# Patient Record
Sex: Female | Born: 1950
Health system: Southern US, Community
[De-identification: ages and names within clinical notes are randomized; demographics above are authoritative.]

## PROBLEM LIST (undated history)

## (undated) DIAGNOSIS — R350 Frequency of micturition: Secondary | ICD-10-CM

## (undated) DIAGNOSIS — I10 Essential (primary) hypertension: Secondary | ICD-10-CM

## (undated) DIAGNOSIS — H409 Unspecified glaucoma: Secondary | ICD-10-CM

## (undated) DIAGNOSIS — C679 Malignant neoplasm of bladder, unspecified: Secondary | ICD-10-CM

## (undated) DIAGNOSIS — Z87898 Personal history of other specified conditions: Secondary | ICD-10-CM

## (undated) DIAGNOSIS — R319 Hematuria, unspecified: Secondary | ICD-10-CM

## (undated) HISTORY — PX: KNEE ARTHROSCOPY: SUR90

## (undated) HISTORY — PX: BREAST CYST ASPIRATION: SHX578

## (undated) HISTORY — PX: TONSILLECTOMY: SUR1361

---

## 1993-02-08 HISTORY — PX: SUPRACERVICAL ABDOMINAL HYSTERECTOMY: SHX5393

## 1997-05-20 ENCOUNTER — Other Ambulatory Visit: Admission: RE | Admit: 1997-05-20 | Discharge: 1997-05-20 | Payer: Self-pay | Admitting: Gynecology

## 1997-07-17 ENCOUNTER — Inpatient Hospital Stay (HOSPITAL_COMMUNITY): Admission: RE | Admit: 1997-07-17 | Discharge: 1997-07-19 | Payer: Self-pay | Admitting: Gynecology

## 1999-01-08 ENCOUNTER — Other Ambulatory Visit: Admission: RE | Admit: 1999-01-08 | Discharge: 1999-01-08 | Payer: Self-pay | Admitting: Gynecology

## 1999-01-23 ENCOUNTER — Encounter: Payer: Self-pay | Admitting: Gynecology

## 1999-01-23 ENCOUNTER — Encounter: Admission: RE | Admit: 1999-01-23 | Discharge: 1999-01-23 | Payer: Self-pay | Admitting: Gynecology

## 2000-01-11 ENCOUNTER — Other Ambulatory Visit: Admission: RE | Admit: 2000-01-11 | Discharge: 2000-01-11 | Payer: Self-pay | Admitting: Gynecology

## 2000-02-23 ENCOUNTER — Encounter: Admission: RE | Admit: 2000-02-23 | Discharge: 2000-02-23 | Payer: Self-pay | Admitting: Gynecology

## 2000-02-23 ENCOUNTER — Encounter: Payer: Self-pay | Admitting: Gynecology

## 2001-08-09 ENCOUNTER — Other Ambulatory Visit: Admission: RE | Admit: 2001-08-09 | Discharge: 2001-08-09 | Payer: Self-pay | Admitting: Gynecology

## 2001-11-03 ENCOUNTER — Encounter: Admission: RE | Admit: 2001-11-03 | Discharge: 2001-11-03 | Payer: Self-pay | Admitting: Gynecology

## 2001-11-03 ENCOUNTER — Encounter: Payer: Self-pay | Admitting: Gynecology

## 2002-09-12 ENCOUNTER — Other Ambulatory Visit: Admission: RE | Admit: 2002-09-12 | Discharge: 2002-09-12 | Payer: Self-pay | Admitting: Gynecology

## 2003-01-11 ENCOUNTER — Ambulatory Visit (HOSPITAL_COMMUNITY): Admission: RE | Admit: 2003-01-11 | Discharge: 2003-01-11 | Payer: Self-pay | Admitting: Gynecology

## 2003-09-16 ENCOUNTER — Other Ambulatory Visit: Admission: RE | Admit: 2003-09-16 | Discharge: 2003-09-16 | Payer: Self-pay | Admitting: Gynecology

## 2004-04-13 ENCOUNTER — Ambulatory Visit (HOSPITAL_COMMUNITY): Admission: RE | Admit: 2004-04-13 | Discharge: 2004-04-13 | Payer: Self-pay | Admitting: Gynecology

## 2004-11-18 ENCOUNTER — Other Ambulatory Visit: Admission: RE | Admit: 2004-11-18 | Discharge: 2004-11-18 | Payer: Self-pay | Admitting: Gynecology

## 2005-04-15 ENCOUNTER — Ambulatory Visit (HOSPITAL_COMMUNITY): Admission: RE | Admit: 2005-04-15 | Discharge: 2005-04-15 | Payer: Self-pay | Admitting: Gynecology

## 2006-01-24 ENCOUNTER — Other Ambulatory Visit: Admission: RE | Admit: 2006-01-24 | Discharge: 2006-01-24 | Payer: Self-pay | Admitting: Gynecology

## 2006-04-18 ENCOUNTER — Ambulatory Visit (HOSPITAL_COMMUNITY): Admission: RE | Admit: 2006-04-18 | Discharge: 2006-04-18 | Payer: Self-pay | Admitting: Gynecology

## 2007-01-27 ENCOUNTER — Other Ambulatory Visit: Admission: RE | Admit: 2007-01-27 | Discharge: 2007-01-27 | Payer: Self-pay | Admitting: Gynecology

## 2007-04-21 ENCOUNTER — Ambulatory Visit (HOSPITAL_COMMUNITY): Admission: RE | Admit: 2007-04-21 | Discharge: 2007-04-21 | Payer: Self-pay | Admitting: Gynecology

## 2008-05-08 ENCOUNTER — Ambulatory Visit (HOSPITAL_COMMUNITY): Admission: RE | Admit: 2008-05-08 | Discharge: 2008-05-08 | Payer: Self-pay | Admitting: Gynecology

## 2009-05-09 ENCOUNTER — Ambulatory Visit (HOSPITAL_COMMUNITY): Admission: RE | Admit: 2009-05-09 | Discharge: 2009-05-09 | Payer: Self-pay | Admitting: Gynecology

## 2010-05-25 ENCOUNTER — Other Ambulatory Visit (HOSPITAL_COMMUNITY): Payer: Self-pay | Admitting: Gynecology

## 2010-05-25 DIAGNOSIS — Z1231 Encounter for screening mammogram for malignant neoplasm of breast: Secondary | ICD-10-CM

## 2010-05-28 ENCOUNTER — Ambulatory Visit (HOSPITAL_COMMUNITY)
Admission: RE | Admit: 2010-05-28 | Discharge: 2010-05-28 | Disposition: A | Discharge status: Home / Self Care | Payer: BC Managed Care – PPO | Source: Ambulatory Visit | Attending: Gynecology | Admitting: Gynecology

## 2010-05-28 DIAGNOSIS — Z1231 Encounter for screening mammogram for malignant neoplasm of breast: Secondary | ICD-10-CM | POA: Insufficient documentation

## 2011-05-24 ENCOUNTER — Other Ambulatory Visit (HOSPITAL_COMMUNITY): Payer: Self-pay | Admitting: Gynecology

## 2011-05-24 DIAGNOSIS — Z1231 Encounter for screening mammogram for malignant neoplasm of breast: Secondary | ICD-10-CM

## 2011-07-07 ENCOUNTER — Ambulatory Visit (HOSPITAL_COMMUNITY): Payer: BC Managed Care – PPO

## 2011-07-08 ENCOUNTER — Ambulatory Visit (HOSPITAL_COMMUNITY)
Admission: RE | Admit: 2011-07-08 | Discharge: 2011-07-08 | Disposition: A | Discharge status: Home / Self Care | Payer: BC Managed Care – PPO | Source: Ambulatory Visit | Attending: Gynecology | Admitting: Gynecology

## 2011-07-08 DIAGNOSIS — Z1231 Encounter for screening mammogram for malignant neoplasm of breast: Secondary | ICD-10-CM

## 2012-08-21 ENCOUNTER — Other Ambulatory Visit (HOSPITAL_COMMUNITY): Payer: Self-pay | Admitting: Obstetrics and Gynecology

## 2012-08-21 DIAGNOSIS — Z1231 Encounter for screening mammogram for malignant neoplasm of breast: Secondary | ICD-10-CM

## 2012-09-01 ENCOUNTER — Ambulatory Visit (HOSPITAL_COMMUNITY)
Admission: RE | Admit: 2012-09-01 | Discharge: 2012-09-01 | Disposition: A | Discharge status: Home / Self Care | Payer: BC Managed Care – PPO | Source: Ambulatory Visit | Attending: Obstetrics and Gynecology | Admitting: Obstetrics and Gynecology

## 2012-09-01 DIAGNOSIS — Z1231 Encounter for screening mammogram for malignant neoplasm of breast: Secondary | ICD-10-CM | POA: Insufficient documentation

## 2013-07-31 ENCOUNTER — Other Ambulatory Visit (HOSPITAL_COMMUNITY): Payer: Self-pay | Admitting: Internal Medicine

## 2013-07-31 DIAGNOSIS — Z1231 Encounter for screening mammogram for malignant neoplasm of breast: Secondary | ICD-10-CM

## 2013-09-04 ENCOUNTER — Ambulatory Visit (HOSPITAL_COMMUNITY): Payer: BC Managed Care – PPO

## 2013-09-12 ENCOUNTER — Ambulatory Visit (HOSPITAL_COMMUNITY)
Admission: RE | Admit: 2013-09-12 | Discharge: 2013-09-12 | Disposition: A | Discharge status: Home / Self Care | Payer: BC Managed Care – PPO | Source: Ambulatory Visit | Attending: Internal Medicine | Admitting: Internal Medicine

## 2013-09-12 DIAGNOSIS — Z1231 Encounter for screening mammogram for malignant neoplasm of breast: Secondary | ICD-10-CM | POA: Insufficient documentation

## 2014-02-08 HISTORY — PX: BREAST BIOPSY: SHX20

## 2014-11-04 ENCOUNTER — Other Ambulatory Visit: Payer: Self-pay

## 2014-11-04 DIAGNOSIS — Z1231 Encounter for screening mammogram for malignant neoplasm of breast: Secondary | ICD-10-CM

## 2014-11-08 ENCOUNTER — Ambulatory Visit
Admission: RE | Admit: 2014-11-08 | Discharge: 2014-11-08 | Disposition: A | Discharge status: Home / Self Care | Payer: BC Managed Care – PPO | Source: Ambulatory Visit

## 2014-11-08 DIAGNOSIS — Z1231 Encounter for screening mammogram for malignant neoplasm of breast: Secondary | ICD-10-CM

## 2014-11-12 ENCOUNTER — Other Ambulatory Visit: Payer: Self-pay | Admitting: Internal Medicine

## 2014-11-12 DIAGNOSIS — R928 Other abnormal and inconclusive findings on diagnostic imaging of breast: Secondary | ICD-10-CM

## 2014-11-20 ENCOUNTER — Ambulatory Visit
Admission: RE | Admit: 2014-11-20 | Discharge: 2014-11-20 | Disposition: A | Discharge status: Home / Self Care | Payer: BC Managed Care – PPO | Source: Ambulatory Visit | Attending: Internal Medicine | Admitting: Internal Medicine

## 2014-11-20 ENCOUNTER — Other Ambulatory Visit: Payer: Self-pay | Admitting: Internal Medicine

## 2014-11-20 DIAGNOSIS — R928 Other abnormal and inconclusive findings on diagnostic imaging of breast: Secondary | ICD-10-CM

## 2014-11-20 DIAGNOSIS — N6310 Unspecified lump in the right breast, unspecified quadrant: Secondary | ICD-10-CM

## 2014-11-21 ENCOUNTER — Other Ambulatory Visit: Payer: Self-pay | Admitting: Internal Medicine

## 2014-11-21 ENCOUNTER — Ambulatory Visit
Admission: RE | Admit: 2014-11-21 | Discharge: 2014-11-21 | Disposition: A | Discharge status: Home / Self Care | Payer: BC Managed Care – PPO | Source: Ambulatory Visit | Attending: Internal Medicine | Admitting: Internal Medicine

## 2014-11-21 DIAGNOSIS — N6310 Unspecified lump in the right breast, unspecified quadrant: Secondary | ICD-10-CM

## 2015-10-02 ENCOUNTER — Encounter (INDEPENDENT_AMBULATORY_CARE_PROVIDER_SITE_OTHER): Payer: Self-pay

## 2015-10-02 ENCOUNTER — Other Ambulatory Visit: Payer: Self-pay | Admitting: Internal Medicine

## 2015-10-02 ENCOUNTER — Ambulatory Visit (HOSPITAL_COMMUNITY): Payer: Medicare Other | Attending: Cardiology

## 2015-10-02 ENCOUNTER — Other Ambulatory Visit: Payer: Self-pay

## 2015-10-02 DIAGNOSIS — I340 Nonrheumatic mitral (valve) insufficiency: Secondary | ICD-10-CM | POA: Insufficient documentation

## 2015-10-02 DIAGNOSIS — R011 Cardiac murmur, unspecified: Secondary | ICD-10-CM | POA: Diagnosis not present

## 2015-10-02 DIAGNOSIS — I313 Pericardial effusion (noninflammatory): Secondary | ICD-10-CM | POA: Insufficient documentation

## 2015-10-02 DIAGNOSIS — R9431 Abnormal electrocardiogram [ECG] [EKG]: Secondary | ICD-10-CM

## 2015-12-10 ENCOUNTER — Other Ambulatory Visit: Payer: Self-pay | Admitting: Internal Medicine

## 2015-12-10 DIAGNOSIS — Z1231 Encounter for screening mammogram for malignant neoplasm of breast: Secondary | ICD-10-CM

## 2015-12-11 ENCOUNTER — Ambulatory Visit
Admission: RE | Admit: 2015-12-11 | Discharge: 2015-12-11 | Disposition: A | Discharge status: Home / Self Care | Payer: Medicare Other | Source: Ambulatory Visit | Attending: Internal Medicine | Admitting: Internal Medicine

## 2015-12-11 DIAGNOSIS — Z1231 Encounter for screening mammogram for malignant neoplasm of breast: Secondary | ICD-10-CM

## 2016-02-09 HISTORY — PX: CATARACT EXTRACTION W/ INTRAOCULAR LENS  IMPLANT, BILATERAL: SHX1307

## 2016-12-22 ENCOUNTER — Other Ambulatory Visit: Payer: Self-pay | Admitting: Obstetrics and Gynecology

## 2016-12-22 DIAGNOSIS — Z1231 Encounter for screening mammogram for malignant neoplasm of breast: Secondary | ICD-10-CM

## 2017-01-19 ENCOUNTER — Ambulatory Visit
Admission: RE | Admit: 2017-01-19 | Discharge: 2017-01-19 | Disposition: A | Discharge status: Home / Self Care | Payer: Medicare Other | Source: Ambulatory Visit | Attending: Obstetrics and Gynecology | Admitting: Obstetrics and Gynecology

## 2017-01-19 DIAGNOSIS — Z1231 Encounter for screening mammogram for malignant neoplasm of breast: Secondary | ICD-10-CM

## 2017-12-09 ENCOUNTER — Other Ambulatory Visit: Payer: Self-pay | Admitting: Obstetrics and Gynecology

## 2017-12-09 DIAGNOSIS — Z1231 Encounter for screening mammogram for malignant neoplasm of breast: Secondary | ICD-10-CM

## 2018-01-23 ENCOUNTER — Ambulatory Visit
Admission: RE | Admit: 2018-01-23 | Discharge: 2018-01-23 | Disposition: A | Discharge status: Home / Self Care | Payer: Medicare Other | Source: Ambulatory Visit | Attending: Obstetrics and Gynecology | Admitting: Obstetrics and Gynecology

## 2018-01-23 DIAGNOSIS — Z1231 Encounter for screening mammogram for malignant neoplasm of breast: Secondary | ICD-10-CM

## 2019-01-03 ENCOUNTER — Other Ambulatory Visit: Payer: Self-pay | Admitting: Obstetrics and Gynecology

## 2019-01-03 DIAGNOSIS — Z1231 Encounter for screening mammogram for malignant neoplasm of breast: Secondary | ICD-10-CM

## 2019-03-05 ENCOUNTER — Ambulatory Visit: Payer: Medicare Other

## 2019-03-08 ENCOUNTER — Ambulatory Visit
Admission: RE | Admit: 2019-03-08 | Discharge: 2019-03-08 | Disposition: A | Discharge status: Home / Self Care | Payer: Medicare PPO | Source: Ambulatory Visit | Attending: Obstetrics and Gynecology | Admitting: Obstetrics and Gynecology

## 2019-03-08 ENCOUNTER — Other Ambulatory Visit: Payer: Self-pay

## 2019-03-08 DIAGNOSIS — Z1231 Encounter for screening mammogram for malignant neoplasm of breast: Secondary | ICD-10-CM

## 2019-08-28 DIAGNOSIS — Z6825 Body mass index (BMI) 25.0-25.9, adult: Secondary | ICD-10-CM | POA: Diagnosis not present

## 2019-08-28 DIAGNOSIS — R319 Hematuria, unspecified: Secondary | ICD-10-CM | POA: Diagnosis not present

## 2019-08-28 DIAGNOSIS — R300 Dysuria: Secondary | ICD-10-CM | POA: Diagnosis not present

## 2019-09-10 DIAGNOSIS — Z961 Presence of intraocular lens: Secondary | ICD-10-CM | POA: Diagnosis not present

## 2019-09-10 DIAGNOSIS — H401131 Primary open-angle glaucoma, bilateral, mild stage: Secondary | ICD-10-CM | POA: Diagnosis not present

## 2019-10-29 DIAGNOSIS — E7800 Pure hypercholesterolemia, unspecified: Secondary | ICD-10-CM | POA: Diagnosis not present

## 2019-10-29 DIAGNOSIS — I10 Essential (primary) hypertension: Secondary | ICD-10-CM | POA: Diagnosis not present

## 2019-10-30 DIAGNOSIS — Z23 Encounter for immunization: Secondary | ICD-10-CM | POA: Diagnosis not present

## 2019-10-30 DIAGNOSIS — Z1389 Encounter for screening for other disorder: Secondary | ICD-10-CM | POA: Diagnosis not present

## 2019-10-30 DIAGNOSIS — R350 Frequency of micturition: Secondary | ICD-10-CM | POA: Diagnosis not present

## 2019-10-30 DIAGNOSIS — N3941 Urge incontinence: Secondary | ICD-10-CM | POA: Diagnosis not present

## 2019-10-30 DIAGNOSIS — Z0000 Encounter for general adult medical examination without abnormal findings: Secondary | ICD-10-CM | POA: Diagnosis not present

## 2019-10-30 DIAGNOSIS — I10 Essential (primary) hypertension: Secondary | ICD-10-CM | POA: Diagnosis not present

## 2019-10-30 DIAGNOSIS — E7800 Pure hypercholesterolemia, unspecified: Secondary | ICD-10-CM | POA: Diagnosis not present

## 2019-10-30 DIAGNOSIS — R310 Gross hematuria: Secondary | ICD-10-CM | POA: Diagnosis not present

## 2019-11-01 ENCOUNTER — Other Ambulatory Visit: Payer: Self-pay | Admitting: Internal Medicine

## 2019-11-01 DIAGNOSIS — E7800 Pure hypercholesterolemia, unspecified: Secondary | ICD-10-CM

## 2019-11-09 DIAGNOSIS — R310 Gross hematuria: Secondary | ICD-10-CM | POA: Diagnosis not present

## 2019-11-09 DIAGNOSIS — R350 Frequency of micturition: Secondary | ICD-10-CM | POA: Diagnosis not present

## 2019-11-12 DIAGNOSIS — N281 Cyst of kidney, acquired: Secondary | ICD-10-CM | POA: Diagnosis not present

## 2019-11-12 DIAGNOSIS — N329 Bladder disorder, unspecified: Secondary | ICD-10-CM | POA: Diagnosis not present

## 2019-11-12 DIAGNOSIS — R310 Gross hematuria: Secondary | ICD-10-CM | POA: Diagnosis not present

## 2019-11-12 DIAGNOSIS — R319 Hematuria, unspecified: Secondary | ICD-10-CM | POA: Diagnosis not present

## 2019-11-30 DIAGNOSIS — R310 Gross hematuria: Secondary | ICD-10-CM | POA: Diagnosis not present

## 2019-11-30 DIAGNOSIS — C678 Malignant neoplasm of overlapping sites of bladder: Secondary | ICD-10-CM | POA: Diagnosis not present

## 2019-12-03 ENCOUNTER — Other Ambulatory Visit: Payer: Self-pay | Admitting: Urology

## 2019-12-10 ENCOUNTER — Other Ambulatory Visit: Payer: Self-pay

## 2019-12-10 ENCOUNTER — Encounter (HOSPITAL_BASED_OUTPATIENT_CLINIC_OR_DEPARTMENT_OTHER): Payer: Self-pay | Admitting: Urology

## 2019-12-10 NOTE — Progress Notes (Signed)
Spoke w/ via phone for pre-op interview--- PT Lab needs dos----   Istat and EKG            Lab results------ no COVID test ------ 12-14-2019 @1435  Arrive at ------- 0800 NPO after MN NO Solid Food.  Clear liquids from MN until--- 0700 Medications to take morning of surgery ----- Norvasc and eye drops as usual Diabetic medication -----  n/a Patient Special Instructions ----- n/a Pre-Op special Istructions ----- n/a Patient verbalized understanding of instructions that were given at this phone interview. Patient denies shortness of breath, chest pain, fever, cough at this phone interview.

## 2019-12-14 ENCOUNTER — Other Ambulatory Visit (HOSPITAL_COMMUNITY)
Admission: RE | Admit: 2019-12-14 | Discharge: 2019-12-14 | Disposition: A | Discharge status: Home / Self Care | Payer: Medicare PPO | Source: Ambulatory Visit | Attending: Urology | Admitting: Urology

## 2019-12-14 DIAGNOSIS — Z01812 Encounter for preprocedural laboratory examination: Secondary | ICD-10-CM | POA: Insufficient documentation

## 2019-12-14 DIAGNOSIS — Z20822 Contact with and (suspected) exposure to covid-19: Secondary | ICD-10-CM | POA: Diagnosis not present

## 2019-12-14 LAB — SARS CORONAVIRUS 2 (TAT 6-24 HRS): SARS Coronavirus 2: NEGATIVE

## 2019-12-17 NOTE — H&P (Signed)
: was consulted to assess the patient's recurrent blood in the urine. She primarily sees it when she wipes. Two months ago she describes a visit to urgent care. No increased frequency or any pain. No smoking history. Does not take daily aspirin or blood thinner   At baseline I think she has mild urge incontinence and will wear a pad when she leaves the home. No stress incontinence.   She voids every 1 hour cannot hold it for 2 hours. She gets up at least 3 times a night. No ankle edema. No diuretic   patient has a history hematuria. She has a frequent bladder both day and night. She has little bit urge incontinence. After I work her up for hematuria I will ask her wetter treatment goals are for her overactive bladder. She will have a CT scan and return for pelvic and cystoscopy that was deferred for insurance reasons. Call if urine culture positive   Today  I did not treat the low count of Streptococcus. No CT scan done.  on cystoscopy the patient has multiple bladder tumors. There were more on the right posterior wall and floor the largest probably was approximately 2 cm or less. There was multiple small tumors all looks superficial. She did have some prominent blood vessels but the could be areas of carcinoma in situ or patches of redness  picture was drawn. Likely patient has superficial bladder cancer and possibly could have carcinoma in situ. She will get a CT scan on Monday and see my partner who will review trans urethral resections and biopsies  CLINICAL DATA: Hematuria   EXAM:  CT ABDOMEN AND PELVIS WITHOUT AND WITH CONTRAST   TECHNIQUE:  Multidetector CT imaging of the abdomen and pelvis was performed  following the standard protocol before and following the bolus  administration of intravenous contrast.   CONTRAST: 125 mL Omnipaque 300 IV   COMPARISON: None.   FINDINGS:  Lower chest: Lung bases are clear.   Hepatobiliary: Two subcentimeter cysts in the right hepatic dome   (series 5/image 10).   Gallbladder is underdistended but grossly unremarkable. No  intrahepatic or extrahepatic ductal dilatation.   Pancreas: Within normal limits.   Spleen: Within normal limits.   Adrenals/Urinary Tract: Adrenal glands are within normal limits.   8 mm cyst in the posterior interpolar right kidney (series 5/image  33). No enhancing renal lesions.   No renal, ureteral, or bladder calculi. No hydronephrosis.   On delayed imaging, there are no filling defects in the bilateral  renal collecting systems or ureters.   Polypoid filling defect in the right posterior bladder (series  9/image 79), with a corresponding 2.1 x 2.6 cm enhancing lesion  (series 5/image 79). Additional small polypoid enhancing lesion  along the left anterior bladder (series 5/image 75) raising concern  for multifocal disease.   Stomach/Bowel: Stomach is within normal limits.   No evidence of bowel obstruction.   Normal appendix (series 5/image 53).   Vascular/Lymphatic: No evidence of abdominal aortic aneurysm.   Atherosclerotic calcifications of the abdominal aorta and branch  vessels.   No suspicious abdominopelvic lymphadenopathy.   Reproductive: Status post supracervical hysterectomy.   No adnexal masses.   Other: No abdominopelvic ascites.   Musculoskeletal: Visualized osseous structures are within normal  limits.   IMPRESSION:  2.6 cm enhancing lesion along the right posterior bladder,  suspicious for primary bladder neoplasm. Possible additional lesion  in the left anterior bladder. Cystoscopy is suggested.   No findings suspicious  for upper tract or metastatic disease.   These results will be called to the ordering clinician or  representative by the Radiologist Assistant, and communication  documented in the PACS or [facility] Dashboard.  -11/30/19-patient is a 69 year old female previously followed by Dr.MacDiarmid all with history of hematuria. Patient underwent  cystoscopy which showed multiple papillary tumors within the urinary bladder. Also has had a CT scan subsequently showing centrally normal upper urinary tract but filling defects within the urinary bladder as seen on cysto. Now here for follow-up discussion regarding management of her bladder tumors.      ALLERGIES: None   MEDICATIONS: None   GU PSH: Cystoscopy - 11/09/2019 Locm 300-399Mg /Ml Iodine,1Ml - 11/12/2019     NON-GU PSH: None   GU PMH: Gross hematuria - 11/12/2019, - 11/09/2019, - 10/30/2019 Urinary Frequency - 11/09/2019, - 10/30/2019 Urge incontinence - 10/30/2019    NON-GU PMH: None   FAMILY HISTORY: None   SOCIAL HISTORY: Marital Status: Married Preferred Language: English; Ethnicity: Not Hispanic Or Latino; Race: Black or African American Current Smoking Status: Patient has never smoked.   Tobacco Use Assessment Completed: Used Tobacco in last 30 days? Does not use smokeless tobacco. Does drink.  Does not use drugs. Drinks 2 caffeinated drinks per day. Has not had a blood transfusion.    REVIEW OF SYSTEMS:    GU Review Female:   Patient denies frequent urination, hard to postpone urination, burning /pain with urination, get up at night to urinate, leakage of urine, stream starts and stops, trouble starting your stream, have to strain to urinate, and being pregnant.  Gastrointestinal (Upper):   Patient denies nausea, vomiting, and indigestion/ heartburn.  Gastrointestinal (Lower):   Patient denies diarrhea and constipation.  Constitutional:   Patient denies fever, night sweats, weight loss, and fatigue.  Skin:   Patient denies skin rash/ lesion and itching.  Eyes:   Patient denies blurred vision and double vision.  Ears/ Nose/ Throat:   Patient denies sore throat and sinus problems.  Hematologic/Lymphatic:   Patient denies swollen glands and easy bruising.  Cardiovascular:   Patient denies leg swelling and chest pains.  Respiratory:   Patient denies cough and  shortness of breath.  Endocrine:   Patient denies excessive thirst.  Musculoskeletal:   Patient denies joint pain and back pain.  Neurological:   Patient denies headaches and dizziness.  Psychologic:   Patient denies depression and anxiety.   VITAL SIGNS:      11/30/2019 02:44 PM  Weight 155 lb / 70.31 kg  Height 66 in / 167.64 cm  BP 162/83 mmHg  Heart Rate 64 /min  Temperature 98.2 F / 36.7 C  BMI 25.0 kg/m   GU PHYSICAL EXAMINATION:    Breast: Symmetrical. No tenderness, no nipple discharge, no skin changes. No mass.  Digital Rectal Exam: Normal sphincter tone. No rectal mass.  External Genitalia: No hirsutism, no rash, no scarring, no cyst, no erythematous lesion, no papular lesion, no blanched lesion, no warty lesion. No edema.  Urethral Meatus: Normal size. Normal position. No discharge.  Urethra: No tenderness, no mass, no scarring. No hypermobility. No leakage.  Bladder: Normal to palpation, no tenderness, no mass, normal size.  Vagina: No atrophy, no stenosis. No rectocele. No cystocele. No enterocele.  Cervix: No inflammation, no discharge, no lesion, no tenderness, no wart.  Uterus: Normal size. Normal consistency. Normal position. No mobility. No descent.  Adnexa / Parametria: No tenderness. No adnexal mass. Normal left ovary. Normal right ovary.  Anus and Perineum: No hemorrhoids. No anal stenosis. No rectal fissure, no anal fissure. No edema, no dimple, no perineal tenderness, no anal tenderness.   MULTI-SYSTEM PHYSICAL EXAMINATION:    Constitutional: Well-nourished. No physical deformities. Normally developed. Good grooming.  Neck: Neck symmetrical, not swollen. Normal tracheal position.  Respiratory: No labored breathing, no use of accessory muscles.   Cardiovascular: Normal temperature, normal extremity pulses, no swelling, no varicosities.  Lymphatic: No enlargement of neck, axillae, groin.  Skin: No paleness, no jaundice, no cyanosis. No lesion, no ulcer, no  rash.  Neurologic / Psychiatric: Oriented to time, oriented to place, oriented to person. No depression, no anxiety, no agitation.  Gastrointestinal: No mass, no tenderness, no rigidity, non obese abdomen.  Eyes: Normal conjunctivae. Normal eyelids.  Ears, Nose, Mouth, and Throat: Left ear no scars, no lesions, no masses. Right ear no scars, no lesions, no masses. Nose no scars, no lesions, no masses. Normal hearing. Normal lips.  Musculoskeletal: Normal gait and station of head and neck.     PAST DATA REVIEW: None   PROCEDURES:          Urinalysis w/Scope - 81001 Dipstick Dipstick Cont'd Micro  Color: Straw Bilirubin: Neg mg/dL WBC/hpf: 0 - 5/hpf  Appearance: Slightly Cloudy Ketones: Neg mg/dL RBC/hpf: 0 - 2/hpf  Specific Gravity: 1.015 Blood: 1+ ery/uL Bacteria: NS (Not Seen)  pH: 6.5 Protein: Neg mg/dL Cystals: NS (Not Seen)  Glucose: Neg mg/dL Urobilinogen: 0.2 mg/dL Casts: NS (Not Seen)    Nitrites: Neg Trichomonas: Not Present    Leukocyte Esterase: Trace leu/uL Mucous: Present      Epithelial Cells: NS (Not Seen)      Yeast: NS (Not Seen)      Sperm: Not Present    ASSESSMENT:      ICD-10 Details  1 GU:   Gross hematuria - J19.4 Acute, Complicated Injury  2   Bladder Cancer overlapping sites - R74.0 Acute, Complicated Injury   PLAN:           Document Letter(s):  Created for Patient: Clinical Summary         Notes:   I discussed the CT findings along with Dr.MacDiarmid's cystoscopic findings highly suggestive of primary bladder cancer. Recommended cysto and retrogrades and TURBT. Risks and benefits the procedure were discussed in detail today. Will schedule accordingly in the near future.  TURBT consent: I have discussed with the patient the risks, benefits of TURBT which include but are not limited to: Bleeding, infection, damage to the bladder with potential perforation of the bladder, damage to surrounding organs, possible need for further procedures including open  repair and catheterization, possibility of nonhealing area within the bladder, urgency, frequency which may be refractory to medications. I pointed out that in some occasions after resection of the bladder tumor, mitomycin-C chemotherapy may be instilled into the bladder. The risks associated with this therapy include but are not limited to: Refractory or new onset urgency, frequency, dysuria, infrequently severe systemic side effects secondary to mitomycin-C. After full discussion of the risks, benefits and alternatives, the patient has consented to the above procedure and desires to proceed.

## 2019-12-18 ENCOUNTER — Ambulatory Visit (HOSPITAL_BASED_OUTPATIENT_CLINIC_OR_DEPARTMENT_OTHER): Payer: Medicare PPO | Admitting: Anesthesiology

## 2019-12-18 ENCOUNTER — Other Ambulatory Visit: Payer: Self-pay

## 2019-12-18 ENCOUNTER — Ambulatory Visit (HOSPITAL_BASED_OUTPATIENT_CLINIC_OR_DEPARTMENT_OTHER)
Admission: RE | Admit: 2019-12-18 | Discharge: 2019-12-19 | Disposition: A | Discharge status: Home / Self Care | Payer: Medicare PPO | Attending: Urology | Admitting: Urology

## 2019-12-18 ENCOUNTER — Encounter (HOSPITAL_BASED_OUTPATIENT_CLINIC_OR_DEPARTMENT_OTHER): Payer: Self-pay | Admitting: Urology

## 2019-12-18 ENCOUNTER — Encounter (HOSPITAL_BASED_OUTPATIENT_CLINIC_OR_DEPARTMENT_OTHER)
Admission: RE | Disposition: A | Discharge status: Home / Self Care | Payer: Self-pay | Source: Home / Self Care | Attending: Urology

## 2019-12-18 DIAGNOSIS — I10 Essential (primary) hypertension: Secondary | ICD-10-CM | POA: Diagnosis not present

## 2019-12-18 DIAGNOSIS — D494 Neoplasm of unspecified behavior of bladder: Secondary | ICD-10-CM | POA: Diagnosis not present

## 2019-12-18 DIAGNOSIS — C679 Malignant neoplasm of bladder, unspecified: Secondary | ICD-10-CM | POA: Diagnosis not present

## 2019-12-18 DIAGNOSIS — H409 Unspecified glaucoma: Secondary | ICD-10-CM | POA: Diagnosis not present

## 2019-12-18 HISTORY — DX: Unspecified glaucoma: H40.9

## 2019-12-18 HISTORY — DX: Essential (primary) hypertension: I10

## 2019-12-18 HISTORY — DX: Frequency of micturition: R35.0

## 2019-12-18 HISTORY — PX: TRANSURETHRAL RESECTION OF BLADDER TUMOR: SHX2575

## 2019-12-18 HISTORY — DX: Personal history of other specified conditions: Z87.898

## 2019-12-18 HISTORY — DX: Hematuria, unspecified: R31.9

## 2019-12-18 HISTORY — PX: CYSTOSCOPY W/ RETROGRADES: SHX1426

## 2019-12-18 HISTORY — DX: Malignant neoplasm of bladder, unspecified: C67.9

## 2019-12-18 LAB — POCT I-STAT, CHEM 8
BUN: 24 mg/dL — ABNORMAL HIGH (ref 8–23)
Calcium, Ion: 1.36 mmol/L (ref 1.15–1.40)
Chloride: 104 mmol/L (ref 98–111)
Creatinine, Ser: 0.6 mg/dL (ref 0.44–1.00)
Glucose, Bld: 98 mg/dL (ref 70–99)
HCT: 41 % (ref 36.0–46.0)
Hemoglobin: 13.9 g/dL (ref 12.0–15.0)
Potassium: 3.9 mmol/L (ref 3.5–5.1)
Sodium: 143 mmol/L (ref 135–145)
TCO2: 26 mmol/L (ref 22–32)

## 2019-12-18 SURGERY — TURBT (TRANSURETHRAL RESECTION OF BLADDER TUMOR)
Anesthesia: General | Site: Renal

## 2019-12-18 MED ORDER — SUGAMMADEX SODIUM 200 MG/2ML IV SOLN
INTRAVENOUS | Status: DC | PRN
  Administered 2019-12-18: 200 mg via INTRAVENOUS

## 2019-12-18 MED ORDER — ROCURONIUM BROMIDE 10 MG/ML (PF) SYRINGE
PREFILLED_SYRINGE | INTRAVENOUS | Status: AC
  Filled 2019-12-18: qty 10

## 2019-12-18 MED ORDER — OXYCODONE HCL 5 MG PO TABS: 5.0000 mg | ORAL_TABLET | Freq: Once | ORAL | Status: DC | PRN

## 2019-12-18 MED ORDER — FENTANYL CITRATE (PF) 100 MCG/2ML IJ SOLN
INTRAMUSCULAR | Status: DC | PRN
  Administered 2019-12-18: 25 ug via INTRAVENOUS
  Administered 2019-12-18: 50 ug via INTRAVENOUS
  Administered 2019-12-18: 25 ug via INTRAVENOUS

## 2019-12-18 MED ORDER — BELLADONNA ALKALOIDS-OPIUM 16.2-60 MG RE SUPP
RECTAL | Status: DC | PRN
  Administered 2019-12-18: 1 via RECTAL

## 2019-12-18 MED ORDER — ONDANSETRON HCL 4 MG/2ML IJ SOLN: 4.0000 mg | Freq: Once | INTRAMUSCULAR | Status: DC | PRN

## 2019-12-18 MED ORDER — HYDROCODONE-ACETAMINOPHEN 5-325 MG PO TABS
1.0000 | ORAL_TABLET | ORAL | Status: DC | PRN
  Administered 2019-12-19: 2 via ORAL
  Administered 2019-12-19: 1 via ORAL

## 2019-12-18 MED ORDER — PHENYLEPHRINE 40 MCG/ML (10ML) SYRINGE FOR IV PUSH (FOR BLOOD PRESSURE SUPPORT)
PREFILLED_SYRINGE | INTRAVENOUS | Status: DC | PRN
  Administered 2019-12-18: 120 ug via INTRAVENOUS
  Administered 2019-12-18 (×3): 80 ug via INTRAVENOUS

## 2019-12-18 MED ORDER — OXYBUTYNIN CHLORIDE 5 MG PO TABS
5.0000 mg | ORAL_TABLET | Freq: Three times a day (TID) | ORAL | Status: DC | PRN
  Administered 2019-12-18 – 2019-12-19 (×2): 5 mg via ORAL

## 2019-12-18 MED ORDER — PHENYLEPHRINE 40 MCG/ML (10ML) SYRINGE FOR IV PUSH (FOR BLOOD PRESSURE SUPPORT)
PREFILLED_SYRINGE | INTRAVENOUS | Status: AC
  Filled 2019-12-18: qty 10

## 2019-12-18 MED ORDER — ONDANSETRON HCL 4 MG/2ML IJ SOLN
INTRAMUSCULAR | Status: DC | PRN
  Administered 2019-12-18: 4 mg via INTRAVENOUS

## 2019-12-18 MED ORDER — ONDANSETRON HCL 4 MG/2ML IJ SOLN: 4.0000 mg | INTRAMUSCULAR | Status: DC | PRN

## 2019-12-18 MED ORDER — PROPOFOL 10 MG/ML IV BOLUS
INTRAVENOUS | Status: DC | PRN
  Administered 2019-12-18: 150 mg via INTRAVENOUS

## 2019-12-18 MED ORDER — FENTANYL CITRATE (PF) 100 MCG/2ML IJ SOLN
INTRAMUSCULAR | Status: AC
  Filled 2019-12-18: qty 2

## 2019-12-18 MED ORDER — CEFAZOLIN SODIUM-DEXTROSE 2-4 GM/100ML-% IV SOLN
INTRAVENOUS | Status: AC
  Filled 2019-12-18: qty 100

## 2019-12-18 MED ORDER — DEXAMETHASONE SODIUM PHOSPHATE 4 MG/ML IJ SOLN
INTRAMUSCULAR | Status: DC | PRN
  Administered 2019-12-18: 10 mg via INTRAVENOUS

## 2019-12-18 MED ORDER — EPHEDRINE SULFATE-NACL 50-0.9 MG/10ML-% IV SOSY
PREFILLED_SYRINGE | INTRAVENOUS | Status: DC | PRN
  Administered 2019-12-18: 10 mg via INTRAVENOUS

## 2019-12-18 MED ORDER — SODIUM CHLORIDE 0.9% FLUSH: 3.0000 mL | INTRAVENOUS | Status: DC | PRN

## 2019-12-18 MED ORDER — SODIUM CHLORIDE 0.9 % IV SOLN: 250.0000 mL | INTRAVENOUS | Status: DC | PRN

## 2019-12-18 MED ORDER — HYDROCODONE-ACETAMINOPHEN 5-325 MG PO TABS
ORAL_TABLET | ORAL | Status: AC
  Filled 2019-12-18: qty 1

## 2019-12-18 MED ORDER — PROPOFOL 500 MG/50ML IV EMUL
INTRAVENOUS | Status: AC
  Filled 2019-12-18: qty 100

## 2019-12-18 MED ORDER — AMLODIPINE BESYLATE 5 MG PO TABS
5.0000 mg | ORAL_TABLET | Freq: Every day | ORAL | Status: DC
  Filled 2019-12-18: qty 1

## 2019-12-18 MED ORDER — LATANOPROST 0.005 % OP SOLN
1.0000 [drp] | Freq: Every day | OPHTHALMIC | Status: DC
  Filled 2019-12-18: qty 2.5

## 2019-12-18 MED ORDER — BELLADONNA ALKALOIDS-OPIUM 16.2-60 MG RE SUPP: 1.0000 | Freq: Four times a day (QID) | RECTAL | Status: DC | PRN

## 2019-12-18 MED ORDER — ROCURONIUM BROMIDE 10 MG/ML (PF) SYRINGE
PREFILLED_SYRINGE | INTRAVENOUS | Status: DC | PRN
  Administered 2019-12-18: 50 mg via INTRAVENOUS

## 2019-12-18 MED ORDER — SODIUM CHLORIDE 0.9 % IR SOLN
Status: DC | PRN
  Administered 2019-12-18 (×2): 6000 mL via INTRAVESICAL
  Administered 2019-12-18: 3000 mL via INTRAVESICAL
  Administered 2019-12-18: 6000 mL via INTRAVESICAL

## 2019-12-18 MED ORDER — TIMOLOL MALEATE 0.25 % OP SOLN
1.0000 [drp] | Freq: Two times a day (BID) | OPHTHALMIC | Status: DC
  Filled 2019-12-18: qty 5

## 2019-12-18 MED ORDER — FENTANYL CITRATE (PF) 100 MCG/2ML IJ SOLN
25.0000 ug | INTRAMUSCULAR | Status: DC | PRN
  Administered 2019-12-18 (×3): 25 ug via INTRAVENOUS

## 2019-12-18 MED ORDER — CEFAZOLIN SODIUM-DEXTROSE 2-4 GM/100ML-% IV SOLN
2.0000 g | INTRAVENOUS | Status: AC
  Administered 2019-12-18: 2 g via INTRAVENOUS

## 2019-12-18 MED ORDER — DEXAMETHASONE SODIUM PHOSPHATE 10 MG/ML IJ SOLN
INTRAMUSCULAR | Status: AC
  Filled 2019-12-18: qty 1

## 2019-12-18 MED ORDER — SODIUM CHLORIDE 0.9% FLUSH: 3.0000 mL | Freq: Two times a day (BID) | INTRAVENOUS | Status: DC

## 2019-12-18 MED ORDER — LACTATED RINGERS IV SOLN: INTRAVENOUS | Status: DC

## 2019-12-18 MED ORDER — AMISULPRIDE (ANTIEMETIC) 5 MG/2ML IV SOLN: 10.0000 mg | Freq: Once | INTRAVENOUS | Status: DC | PRN

## 2019-12-18 MED ORDER — LIDOCAINE 2% (20 MG/ML) 5 ML SYRINGE
INTRAMUSCULAR | Status: AC
  Filled 2019-12-18: qty 5

## 2019-12-18 MED ORDER — ONDANSETRON HCL 4 MG/2ML IJ SOLN
INTRAMUSCULAR | Status: AC
  Filled 2019-12-18: qty 2

## 2019-12-18 MED ORDER — ADULT MULTIVITAMIN W/MINERALS CH
1.0000 | ORAL_TABLET | Freq: Every day | ORAL | Status: DC
  Filled 2019-12-18: qty 1

## 2019-12-18 MED ORDER — LIDOCAINE 2% (20 MG/ML) 5 ML SYRINGE
INTRAMUSCULAR | Status: DC | PRN
  Administered 2019-12-18: 60 mg via INTRAVENOUS

## 2019-12-18 MED ORDER — OXYBUTYNIN CHLORIDE 5 MG PO TABS
ORAL_TABLET | ORAL | Status: AC
  Filled 2019-12-18: qty 1

## 2019-12-18 MED ORDER — ACETAMINOPHEN 325 MG PO TABS: 650.0000 mg | ORAL_TABLET | ORAL | Status: DC | PRN

## 2019-12-18 MED ORDER — IOHEXOL 300 MG/ML  SOLN
INTRAMUSCULAR | Status: DC | PRN
  Administered 2019-12-18: 10 mL via URETHRAL

## 2019-12-18 MED ORDER — HYDROMORPHONE HCL 1 MG/ML IJ SOLN: 0.5000 mg | INTRAMUSCULAR | Status: DC | PRN

## 2019-12-18 MED ORDER — BELLADONNA ALKALOIDS-OPIUM 16.2-60 MG RE SUPP
RECTAL | Status: AC
  Filled 2019-12-18: qty 1

## 2019-12-18 MED ORDER — OXYCODONE HCL 5 MG/5ML PO SOLN: 5.0000 mg | Freq: Once | ORAL | Status: DC | PRN

## 2019-12-18 MED ORDER — KCL IN DEXTROSE-NACL 20-5-0.45 MEQ/L-%-% IV SOLN
INTRAVENOUS | Status: DC
  Filled 2019-12-18 (×2): qty 1000

## 2019-12-18 SURGICAL SUPPLY — 41 items
BAG DRAIN URO-CYSTO SKYTR STRL (DRAIN) ×4 IMPLANT
BAG DRN RND TRDRP ANRFLXCHMBR (UROLOGICAL SUPPLIES) ×2
BAG DRN UROCATH (DRAIN) ×2
BAG URINE DRAIN 2000ML AR STRL (UROLOGICAL SUPPLIES) ×2 IMPLANT
BAG URINE LEG 500ML (DRAIN) IMPLANT
BULB IRRIG PATHFIND (MISCELLANEOUS) IMPLANT
CATH FOLEY 2WAY SLVR  5CC 20FR (CATHETERS)
CATH FOLEY 2WAY SLVR  5CC 22FR (CATHETERS)
CATH FOLEY 2WAY SLVR 5CC 20FR (CATHETERS) IMPLANT
CATH FOLEY 2WAY SLVR 5CC 22FR (CATHETERS) IMPLANT
CATH FOLEY 3WAY 30CC 22FR (CATHETERS) ×2 IMPLANT
CATH URET 5FR 28IN CONE TIP (BALLOONS)
CATH URET 5FR 28IN OPEN ENDED (CATHETERS) ×2 IMPLANT
CATH URET 5FR 70CM CONE TIP (BALLOONS) IMPLANT
CLOTH BEACON ORANGE TIMEOUT ST (SAFETY) ×4 IMPLANT
ELECT COAG BIPOLAR CYL 1.2MMM (ELECTROSURGICAL) ×4
ELECT REM PT RETURN 9FT ADLT (ELECTROSURGICAL)
ELECTRODE COAG BIPLR CYL 1.2MM (ELECTROSURGICAL) IMPLANT
ELECTRODE REM PT RTRN 9FT ADLT (ELECTROSURGICAL) ×2 IMPLANT
EVACUATOR MICROVAS BLADDER (UROLOGICAL SUPPLIES) IMPLANT
FIBER LASER FLEXIVA 365 (UROLOGICAL SUPPLIES) IMPLANT
FIBER LASER TRAC TIP (UROLOGICAL SUPPLIES) IMPLANT
GLOVE BIO SURGEON STRL SZ7.5 (GLOVE) ×4 IMPLANT
GOWN STRL REUS W/ TWL XL LVL3 (GOWN DISPOSABLE) ×2 IMPLANT
GOWN STRL REUS W/TWL XL LVL3 (GOWN DISPOSABLE) ×4
GUIDEWIRE ANG ZIPWIRE 038X150 (WIRE) IMPLANT
GUIDEWIRE STR DUAL SENSOR (WIRE) ×2 IMPLANT
HOLDER FOLEY CATH W/STRAP (MISCELLANEOUS) ×2 IMPLANT
IV NS IRRIG 3000ML ARTHROMATIC (IV SOLUTION) ×16 IMPLANT
KIT TURNOVER CYSTO (KITS) ×4 IMPLANT
LOOP CUT BIPOLAR 24F LRG (ELECTROSURGICAL) ×2 IMPLANT
LOOP MONOPOLAR YLW (ELECTROSURGICAL) IMPLANT
MANIFOLD NEPTUNE II (INSTRUMENTS) ×2 IMPLANT
NS IRRIG 500ML POUR BTL (IV SOLUTION) ×2 IMPLANT
PACK CYSTO (CUSTOM PROCEDURE TRAY) ×4 IMPLANT
PLUG CATH AND CAP STER (CATHETERS) ×2 IMPLANT
SYR 20ML LL LF (SYRINGE) ×4 IMPLANT
SYR TOOMEY IRRIG 70ML (MISCELLANEOUS) ×4
SYRINGE TOOMEY IRRIG 70ML (MISCELLANEOUS) IMPLANT
TUBE CONNECTING 12'X1/4 (SUCTIONS) ×1
TUBE CONNECTING 12X1/4 (SUCTIONS) ×1 IMPLANT

## 2019-12-18 NOTE — Op Note (Signed)
Operative report  Preop diagnosis: Bladder cancer Postop diagnosis: Bladder cancer Procedure: Cystoscopy, bilateral retrogrades with intraoperative interpretation, transurethral section of bladder tumor(large) Surgeon: Milford Cage Anesthesia: General Estimated blood loss minimal Operative findings: Large exophytic tumor emanating from the right lateral bladder wall at least 4 cm in size.  Several other tumors ranging from 1 to 2 cm on the left lateral dome left lateral wall right lateral wall and floor of the bladder posteriorly but not involving ureteral orifices.  Bilateral retrogrades are normal Operative note: After obtaining informed consent for the patient is taken the major cystoscopy suite placed under general anesthesia.  Placed in the dorsolithotomy position genitalia prepped draped usual sterile fashion.  Proper pause and timeout was performed.  Cystoscope scope was advanced into the bladder without difficulty.  This thoroughly spread with 30 and 70 degree lenses.  The above-noted findings with large exophytic tumor being the predominant tumor on the right lateral bladder wall.  Ureteral orifices were uninvolved with tumor.  Bilateral retrogrades were performed with a 5 French open tip catheter revealed normal filling of both upper tracts without evidence of filling defect or hydronephrosis.  Both upper tracts emptied out promptly upon removal of the retrograde catheter.  Attention was then directed towards resection of the tumor.  Utilizing the saline bipolar resectoscope the right lateral bladder wall tumor was resected.  Relaxation was utilized with anesthesia to prevent obturator reflex.  Tumor was completely resected down to the bladder wall.  There was 1 small deep area of resection noted with small area of fat noted.  This was only less than 1 cm in size.  The other tumors were likewise resected down to the base and cauterized.  The area in the posterior floor the bladder was more  carpet-like tumor in this area was scraped utilizing the resectoscope loop and then cauterized with the rollerball with no visible tumor remaining.  All of the tumor fragments were irrigated from the bladder and collected sent to pathology for evaluation.  Final look in the bladder revealed good hemostasis.  24 French three-way Foley was placed but the irrigation port was plugged at the effluent was crystal clear.  Procedure was terminated she was awakened from anesthesia and taken back to the recovery room in stable condition.  No immediate complication from the procedure.

## 2019-12-18 NOTE — Anesthesia Procedure Notes (Signed)
Procedure Name: Intubation Date/Time: 12/18/2019 9:50 AM Performed by: Lieutenant Diego, CRNA Pre-anesthesia Checklist: Patient identified, Emergency Drugs available, Suction available and Patient being monitored Patient Re-evaluated:Patient Re-evaluated prior to induction Oxygen Delivery Method: Circle system utilized Preoxygenation: Pre-oxygenation with 100% oxygen Induction Type: IV induction Ventilation: Mask ventilation without difficulty LMA: LMA inserted LMA Size: 4.0 Number of attempts: 1 Placement Confirmation: positive ETCO2 and breath sounds checked- equal and bilateral Tube secured with: Tape Dental Injury: Teeth and Oropharynx as per pre-operative assessment

## 2019-12-18 NOTE — Transfer of Care (Signed)
Immediate Anesthesia Transfer of Care Note  Patient: Kenyanna S Mcclees  Procedure(s) Performed: Procedure(s) (LRB): TRANSURETHRAL RESECTION OF BLADDER TUMOR (TURBT) (N/A) CYSTOSCOPY WITH RETROGRADE PYELOGRAM (Bilateral)  Patient Location: PACU  Anesthesia Type: General  Level of Consciousness: awake, alert  and oriented  Airway & Oxygen Therapy: Patient Spontanous Breathing and Patient connected to face mask oxygen  Post-op Assessment: Report given to PACU RN and Post -op Vital signs reviewed and stable  Post vital signs: Reviewed and stable  Complications: No apparent anesthesia complications Last Vitals:  Vitals Value Taken Time  BP 129/91 12/18/19 1119  Temp    Pulse 66 12/18/19 1124  Resp 9 12/18/19 1124  SpO2 100 % 12/18/19 1124  Vitals shown include unvalidated device data.  Last Pain:  Vitals:   12/18/19 0842  TempSrc: Oral  PainSc: 0-No pain      Patients Stated Pain Goal: 5 (79/39/68 8648)  Complications: No complications documented.

## 2019-12-18 NOTE — Interval H&P Note (Signed)
History and Physical Interval Note:  12/18/2019 9:22 AM  Kayla Barker  has presented today for surgery, with the diagnosis of BLADDER CANCER.  The various methods of treatment have been discussed with the patient and family. After consideration of risks, benefits and other options for treatment, the patient has consented to  Procedure(s) with comments: TRANSURETHRAL RESECTION OF BLADDER TUMOR (TURBT) (N/A) - 1 HR CYSTOSCOPY WITH RETROGRADE PYELOGRAM (Bilateral) as a surgical intervention.  The patient's history has been reviewed, patient examined, no change in status, stable for surgery.  I have reviewed the patient's chart and labs.  Questions were answered to the patient's satisfaction.     Remi Haggard

## 2019-12-18 NOTE — Anesthesia Preprocedure Evaluation (Addendum)
Anesthesia Evaluation  Patient identified by MRN, date of birth, ID band Patient awake    Reviewed: Allergy & Precautions, NPO status , Patient's Chart, lab work & pertinent test results  History of Anesthesia Complications Negative for: history of anesthetic complications  Airway Mallampati: II  TM Distance: >3 FB Neck ROM: Full    Dental  (+) Teeth Intact   Pulmonary neg pulmonary ROS,    Pulmonary exam normal        Cardiovascular hypertension, Normal cardiovascular exam     Neuro/Psych negative neurological ROS  negative psych ROS   GI/Hepatic negative GI ROS, Neg liver ROS,   Endo/Other  negative endocrine ROS  Renal/GU negative Renal ROS   Bladder cancer    Musculoskeletal negative musculoskeletal ROS (+)   Abdominal   Peds  Hematology negative hematology ROS (+)   Anesthesia Other Findings   Reproductive/Obstetrics                            Anesthesia Physical Anesthesia Plan  ASA: II  Anesthesia Plan: General   Post-op Pain Management:    Induction: Intravenous  PONV Risk Score and Plan: 3 and Ondansetron, Dexamethasone, Midazolam and Treatment may vary due to age or medical condition  Airway Management Planned: LMA  Additional Equipment: None  Intra-op Plan:   Post-operative Plan: Extubation in OR  Informed Consent: I have reviewed the patients History and Physical, chart, labs and discussed the procedure including the risks, benefits and alternatives for the proposed anesthesia with the patient or authorized representative who has indicated his/her understanding and acceptance.     Dental advisory given  Plan Discussed with:   Anesthesia Plan Comments:        Anesthesia Quick Evaluation

## 2019-12-18 NOTE — Anesthesia Postprocedure Evaluation (Signed)
Anesthesia Post Note  Patient: Kayla Barker  Procedure(s) Performed: TRANSURETHRAL RESECTION OF BLADDER TUMOR (TURBT) (N/A Bladder) CYSTOSCOPY WITH RETROGRADE PYELOGRAM (Bilateral Renal)     Patient location during evaluation: PACU Anesthesia Type: General Level of consciousness: awake and alert Pain management: pain level controlled Vital Signs Assessment: post-procedure vital signs reviewed and stable Respiratory status: spontaneous breathing, nonlabored ventilation and respiratory function stable Cardiovascular status: blood pressure returned to baseline and stable Postop Assessment: no apparent nausea or vomiting Anesthetic complications: no   No complications documented.  Last Vitals:  Vitals:   12/18/19 1145 12/18/19 1200  BP: 140/80 (!) 150/80  Pulse: 65 62  Resp: 18 17  Temp:    SpO2: 99% 99%    Last Pain:  Vitals:   12/18/19 1150  TempSrc:   PainSc: 10-Worst pain ever                 Lidia Collum

## 2019-12-19 DIAGNOSIS — C679 Malignant neoplasm of bladder, unspecified: Secondary | ICD-10-CM | POA: Diagnosis not present

## 2019-12-19 MED ORDER — OXYCODONE-ACETAMINOPHEN 5-325 MG PO TABS
1.0000 | ORAL_TABLET | ORAL | 0 refills | Status: AC | PRN
Start: 2019-12-19 — End: 2020-12-18

## 2019-12-19 MED ORDER — OXYBUTYNIN CHLORIDE 5 MG PO TABS
ORAL_TABLET | ORAL | Status: AC
  Filled 2019-12-19: qty 1

## 2019-12-19 MED ORDER — OXYBUTYNIN CHLORIDE ER 10 MG PO TB24: 10.0000 mg | ORAL_TABLET | Freq: Every day | ORAL | 2 refills | Status: AC | PRN

## 2019-12-19 MED ORDER — HYDROCODONE-ACETAMINOPHEN 5-325 MG PO TABS
ORAL_TABLET | ORAL | Status: AC
  Filled 2019-12-19: qty 1

## 2019-12-19 MED ORDER — HYDROCODONE-ACETAMINOPHEN 5-325 MG PO TABS
ORAL_TABLET | ORAL | Status: AC
  Filled 2019-12-19: qty 2

## 2019-12-19 NOTE — Final Progress Note (Signed)
Physician Final Progress Note  Patient ID: CECILEE ROSNER MRN: [id number] DOB/AGE: 69-09-1950 69 y.o.  Admit date: 12/18/2019 Admitting provider: Remi Haggard, MD Discharge date: 12/19/2019 Patient doing well this morning.  Urine is light pink but no clots.  Feel that she is ready to be discharged home with Foley catheter with plans to return in 1 week  Admission Diagnoses: Bladder cancer  Discharge Diagnoses:  Active Problems:   Bladder cancer ([place])    Consults: None  Significant Findings/ Diagnostic Studies:   Procedures: Cystoscopy, bilateral retrogrades and transurethral section of bladder tumor on 12/18/2019  Discharge Condition: good  Disposition:   Diet: Regular diet  Discharge Activity: No lifting, driving, or strenuous exercise for 1 week      Total time spent taking care of this patient: 30 minutes   Signed: Remi Haggard 12/19/2019, 7:47 AM

## 2019-12-19 NOTE — Discharge Summary (Signed)
Physician Discharge Summary  Patient ID: Kayla Barker MRN: [id number] DOB/AGE: 07-15-1950 69 y.o.  Admit date: 12/18/2019 Discharge date: 12/19/2019  Admission Diagnoses:  Discharge Diagnoses:  Active Problems:   Bladder cancer [facility])   Discharged Condition: good  Hospital Course: Patient was admitted on 12/18/2019 for extended stay following TURBT.  Urine was initially clear but was light pink on the first postoperative morning without clots.  The patient was feeling well felt ready for discharge home.  She will be discharged home on Percocet 1-2 p.o. every 4-6 hours as needed for pain as well as oxybutynin 10 mg daily as needed for bladder spasm.  Be scheduled to follow-up in 1 week in the office for Foley removal and pathology review.  Consults: None  Significant Diagnostic Studies: labs:   Treatments: surgery: Cystoscopy, bilateral retrogrades and transurethral section of bladder tumor on 12/18/2019  Discharge Exam: Blood pressure (!) 155/74, pulse 64, temperature 98.1 F (36.7 C), resp. rate 18, height 5\' 6"  (1.676 m), weight 70.3 kg, SpO2 98 %. General appearance: alert and cooperative  Disposition: Discharge disposition: 01-Home or Self Care        Allergies as of 12/19/2019   No Known Allergies     Medication List    TAKE these medications   amLODipine 5 MG tablet Commonly known as: NORVASC Take 5 mg by mouth daily.   estradiol 0.1 MG/GM vaginal cream Commonly known as: ESTRACE Place 1 Applicatorful vaginally 2 (two) times a week.   latanoprost 0.005 % ophthalmic solution Commonly known as: XALATAN Place 1 drop into both eyes at bedtime.   multivitamin tablet Take 1 tablet by mouth daily.   oxybutynin 10 MG 24 hr tablet Commonly known as: Ditropan XL Take 1 tablet (10 mg total) by mouth daily as needed.   oxyCODONE-acetaminophen 5-325 MG tablet Commonly known as: Percocet Take 1 tablet by mouth every 4 (four) hours as needed for severe  pain.   TIMOLOL HEMIHYDRATE OP Apply to eye.   timolol 0.25 % ophthalmic solution Commonly known as: BETIMOL Place 1-2 drops into both eyes 2 (two) times daily.       Follow-up Information    Remi Haggard, MD Follow up on 12/24/2019.   Specialty: Urology Why: For Foley removal and pathology review Contact information: [address] [place] [address] [phone]               Signed: Remi Haggard 12/19/2019, 7:54 AM

## 2019-12-20 ENCOUNTER — Encounter (HOSPITAL_BASED_OUTPATIENT_CLINIC_OR_DEPARTMENT_OTHER): Payer: Self-pay | Admitting: Urology

## 2019-12-20 LAB — SURGICAL PATHOLOGY

## 2019-12-24 DIAGNOSIS — C678 Malignant neoplasm of overlapping sites of bladder: Secondary | ICD-10-CM | POA: Diagnosis not present

## 2019-12-28 ENCOUNTER — Other Ambulatory Visit: Payer: Medicare PPO

## 2020-01-21 ENCOUNTER — Ambulatory Visit
Admission: RE | Admit: 2020-01-21 | Discharge: 2020-01-21 | Disposition: A | Discharge status: Home / Self Care | Payer: Medicare PPO | Source: Ambulatory Visit | Attending: Internal Medicine | Admitting: Internal Medicine

## 2020-01-21 DIAGNOSIS — E7800 Pure hypercholesterolemia, unspecified: Secondary | ICD-10-CM

## 2020-01-21 DIAGNOSIS — C678 Malignant neoplasm of overlapping sites of bladder: Secondary | ICD-10-CM | POA: Diagnosis not present

## 2020-01-22 DIAGNOSIS — C678 Malignant neoplasm of overlapping sites of bladder: Secondary | ICD-10-CM | POA: Diagnosis not present

## 2020-01-22 DIAGNOSIS — Z5111 Encounter for antineoplastic chemotherapy: Secondary | ICD-10-CM | POA: Diagnosis not present

## 2020-01-29 DIAGNOSIS — Z5111 Encounter for antineoplastic chemotherapy: Secondary | ICD-10-CM | POA: Diagnosis not present

## 2020-01-29 DIAGNOSIS — C678 Malignant neoplasm of overlapping sites of bladder: Secondary | ICD-10-CM | POA: Diagnosis not present

## 2020-02-05 DIAGNOSIS — Z5111 Encounter for antineoplastic chemotherapy: Secondary | ICD-10-CM | POA: Diagnosis not present

## 2020-02-05 DIAGNOSIS — C678 Malignant neoplasm of overlapping sites of bladder: Secondary | ICD-10-CM | POA: Diagnosis not present

## 2020-02-12 DIAGNOSIS — Z5111 Encounter for antineoplastic chemotherapy: Secondary | ICD-10-CM | POA: Diagnosis not present

## 2020-02-12 DIAGNOSIS — C678 Malignant neoplasm of overlapping sites of bladder: Secondary | ICD-10-CM | POA: Diagnosis not present

## 2020-02-18 ENCOUNTER — Other Ambulatory Visit: Payer: Self-pay | Admitting: Obstetrics and Gynecology

## 2020-02-18 DIAGNOSIS — Z1231 Encounter for screening mammogram for malignant neoplasm of breast: Secondary | ICD-10-CM

## 2020-02-19 DIAGNOSIS — Z5111 Encounter for antineoplastic chemotherapy: Secondary | ICD-10-CM | POA: Diagnosis not present

## 2020-02-19 DIAGNOSIS — C678 Malignant neoplasm of overlapping sites of bladder: Secondary | ICD-10-CM | POA: Diagnosis not present

## 2020-02-26 DIAGNOSIS — Z5111 Encounter for antineoplastic chemotherapy: Secondary | ICD-10-CM | POA: Diagnosis not present

## 2020-02-26 DIAGNOSIS — C678 Malignant neoplasm of overlapping sites of bladder: Secondary | ICD-10-CM | POA: Diagnosis not present

## 2020-03-31 ENCOUNTER — Ambulatory Visit: Payer: No Typology Code available for payment source

## 2020-04-28 ENCOUNTER — Other Ambulatory Visit: Payer: Self-pay

## 2020-04-28 ENCOUNTER — Ambulatory Visit
Admission: RE | Admit: 2020-04-28 | Discharge: 2020-04-28 | Disposition: A | Discharge status: Home / Self Care | Payer: No Typology Code available for payment source | Source: Ambulatory Visit | Attending: Obstetrics and Gynecology | Admitting: Obstetrics and Gynecology

## 2020-04-28 ENCOUNTER — Ambulatory Visit: Payer: No Typology Code available for payment source

## 2020-04-28 DIAGNOSIS — Z961 Presence of intraocular lens: Secondary | ICD-10-CM | POA: Diagnosis not present

## 2020-04-28 DIAGNOSIS — Z1231 Encounter for screening mammogram for malignant neoplasm of breast: Secondary | ICD-10-CM

## 2020-04-28 DIAGNOSIS — H35372 Puckering of macula, left eye: Secondary | ICD-10-CM | POA: Diagnosis not present

## 2020-04-28 DIAGNOSIS — H401131 Primary open-angle glaucoma, bilateral, mild stage: Secondary | ICD-10-CM | POA: Diagnosis not present

## 2020-04-29 DIAGNOSIS — C679 Malignant neoplasm of bladder, unspecified: Secondary | ICD-10-CM | POA: Diagnosis not present

## 2020-04-29 DIAGNOSIS — E7800 Pure hypercholesterolemia, unspecified: Secondary | ICD-10-CM | POA: Diagnosis not present

## 2020-04-29 DIAGNOSIS — I10 Essential (primary) hypertension: Secondary | ICD-10-CM | POA: Diagnosis not present

## 2020-04-30 DIAGNOSIS — Z124 Encounter for screening for malignant neoplasm of cervix: Secondary | ICD-10-CM | POA: Diagnosis not present

## 2020-04-30 DIAGNOSIS — N941 Unspecified dyspareunia: Secondary | ICD-10-CM | POA: Diagnosis not present

## 2020-04-30 DIAGNOSIS — Z8551 Personal history of malignant neoplasm of bladder: Secondary | ICD-10-CM | POA: Diagnosis not present

## 2020-05-05 DIAGNOSIS — C678 Malignant neoplasm of overlapping sites of bladder: Secondary | ICD-10-CM | POA: Diagnosis not present

## 2020-06-23 DIAGNOSIS — L292 Pruritus vulvae: Secondary | ICD-10-CM | POA: Diagnosis not present

## 2020-06-23 DIAGNOSIS — R309 Painful micturition, unspecified: Secondary | ICD-10-CM | POA: Diagnosis not present

## 2020-06-24 DIAGNOSIS — M25562 Pain in left knee: Secondary | ICD-10-CM | POA: Diagnosis not present

## 2020-08-04 DIAGNOSIS — M25562 Pain in left knee: Secondary | ICD-10-CM | POA: Diagnosis not present

## 2020-08-04 DIAGNOSIS — C678 Malignant neoplasm of overlapping sites of bladder: Secondary | ICD-10-CM | POA: Diagnosis not present

## 2020-08-05 DIAGNOSIS — M25562 Pain in left knee: Secondary | ICD-10-CM | POA: Diagnosis not present

## 2020-08-05 DIAGNOSIS — S83282A Other tear of lateral meniscus, current injury, left knee, initial encounter: Secondary | ICD-10-CM | POA: Diagnosis not present

## 2020-11-03 DIAGNOSIS — I10 Essential (primary) hypertension: Secondary | ICD-10-CM | POA: Diagnosis not present

## 2020-11-03 DIAGNOSIS — Z23 Encounter for immunization: Secondary | ICD-10-CM | POA: Diagnosis not present

## 2020-11-03 DIAGNOSIS — Z1389 Encounter for screening for other disorder: Secondary | ICD-10-CM | POA: Diagnosis not present

## 2020-11-03 DIAGNOSIS — C678 Malignant neoplasm of overlapping sites of bladder: Secondary | ICD-10-CM | POA: Diagnosis not present

## 2020-11-03 DIAGNOSIS — E7800 Pure hypercholesterolemia, unspecified: Secondary | ICD-10-CM | POA: Diagnosis not present

## 2020-11-03 DIAGNOSIS — Z0000 Encounter for general adult medical examination without abnormal findings: Secondary | ICD-10-CM | POA: Diagnosis not present

## 2020-11-03 DIAGNOSIS — E2839 Other primary ovarian failure: Secondary | ICD-10-CM | POA: Diagnosis not present

## 2020-11-04 ENCOUNTER — Other Ambulatory Visit: Payer: Self-pay | Admitting: Internal Medicine

## 2020-11-04 DIAGNOSIS — H401131 Primary open-angle glaucoma, bilateral, mild stage: Secondary | ICD-10-CM | POA: Diagnosis not present

## 2020-11-04 DIAGNOSIS — E2839 Other primary ovarian failure: Secondary | ICD-10-CM

## 2020-11-04 DIAGNOSIS — H35372 Puckering of macula, left eye: Secondary | ICD-10-CM | POA: Diagnosis not present

## 2020-11-13 ENCOUNTER — Other Ambulatory Visit: Payer: Self-pay | Admitting: Internal Medicine

## 2020-11-13 DIAGNOSIS — Z1231 Encounter for screening mammogram for malignant neoplasm of breast: Secondary | ICD-10-CM

## 2020-11-19 DIAGNOSIS — M6752 Plica syndrome, left knee: Secondary | ICD-10-CM | POA: Diagnosis not present

## 2020-11-19 DIAGNOSIS — S83232A Complex tear of medial meniscus, current injury, left knee, initial encounter: Secondary | ICD-10-CM | POA: Diagnosis not present

## 2020-11-19 DIAGNOSIS — G8918 Other acute postprocedural pain: Secondary | ICD-10-CM | POA: Diagnosis not present

## 2020-11-19 DIAGNOSIS — M94262 Chondromalacia, left knee: Secondary | ICD-10-CM | POA: Diagnosis not present

## 2020-11-19 DIAGNOSIS — S83272A Complex tear of lateral meniscus, current injury, left knee, initial encounter: Secondary | ICD-10-CM | POA: Diagnosis not present

## 2020-11-24 DIAGNOSIS — M6281 Muscle weakness (generalized): Secondary | ICD-10-CM | POA: Diagnosis not present

## 2020-11-24 DIAGNOSIS — M25662 Stiffness of left knee, not elsewhere classified: Secondary | ICD-10-CM | POA: Diagnosis not present

## 2020-12-01 DIAGNOSIS — M6281 Muscle weakness (generalized): Secondary | ICD-10-CM | POA: Diagnosis not present

## 2020-12-01 DIAGNOSIS — M25662 Stiffness of left knee, not elsewhere classified: Secondary | ICD-10-CM | POA: Diagnosis not present

## 2020-12-03 DIAGNOSIS — M25662 Stiffness of left knee, not elsewhere classified: Secondary | ICD-10-CM | POA: Diagnosis not present

## 2020-12-03 DIAGNOSIS — M6281 Muscle weakness (generalized): Secondary | ICD-10-CM | POA: Diagnosis not present

## 2020-12-05 DIAGNOSIS — M6281 Muscle weakness (generalized): Secondary | ICD-10-CM | POA: Diagnosis not present

## 2020-12-05 DIAGNOSIS — M25662 Stiffness of left knee, not elsewhere classified: Secondary | ICD-10-CM | POA: Diagnosis not present

## 2020-12-08 DIAGNOSIS — M6281 Muscle weakness (generalized): Secondary | ICD-10-CM | POA: Diagnosis not present

## 2020-12-08 DIAGNOSIS — M25662 Stiffness of left knee, not elsewhere classified: Secondary | ICD-10-CM | POA: Diagnosis not present

## 2020-12-09 DIAGNOSIS — M25562 Pain in left knee: Secondary | ICD-10-CM | POA: Diagnosis not present

## 2020-12-09 DIAGNOSIS — Z9889 Other specified postprocedural states: Secondary | ICD-10-CM | POA: Diagnosis not present

## 2020-12-10 DIAGNOSIS — M6281 Muscle weakness (generalized): Secondary | ICD-10-CM | POA: Diagnosis not present

## 2020-12-10 DIAGNOSIS — M25662 Stiffness of left knee, not elsewhere classified: Secondary | ICD-10-CM | POA: Diagnosis not present

## 2020-12-12 DIAGNOSIS — M6281 Muscle weakness (generalized): Secondary | ICD-10-CM | POA: Diagnosis not present

## 2020-12-12 DIAGNOSIS — M25662 Stiffness of left knee, not elsewhere classified: Secondary | ICD-10-CM | POA: Diagnosis not present

## 2020-12-16 DIAGNOSIS — M6281 Muscle weakness (generalized): Secondary | ICD-10-CM | POA: Diagnosis not present

## 2020-12-16 DIAGNOSIS — M25662 Stiffness of left knee, not elsewhere classified: Secondary | ICD-10-CM | POA: Diagnosis not present

## 2020-12-18 DIAGNOSIS — M25662 Stiffness of left knee, not elsewhere classified: Secondary | ICD-10-CM | POA: Diagnosis not present

## 2020-12-18 DIAGNOSIS — M6281 Muscle weakness (generalized): Secondary | ICD-10-CM | POA: Diagnosis not present

## 2020-12-22 DIAGNOSIS — M6281 Muscle weakness (generalized): Secondary | ICD-10-CM | POA: Diagnosis not present

## 2020-12-22 DIAGNOSIS — M25662 Stiffness of left knee, not elsewhere classified: Secondary | ICD-10-CM | POA: Diagnosis not present

## 2020-12-24 DIAGNOSIS — M25662 Stiffness of left knee, not elsewhere classified: Secondary | ICD-10-CM | POA: Diagnosis not present

## 2020-12-24 DIAGNOSIS — M6281 Muscle weakness (generalized): Secondary | ICD-10-CM | POA: Diagnosis not present

## 2020-12-26 DIAGNOSIS — M25662 Stiffness of left knee, not elsewhere classified: Secondary | ICD-10-CM | POA: Diagnosis not present

## 2020-12-26 DIAGNOSIS — M6281 Muscle weakness (generalized): Secondary | ICD-10-CM | POA: Diagnosis not present

## 2020-12-30 IMAGING — MG DIGITAL SCREENING BILAT W/ CAD
4 series · 4 of 4 positions shown · non-contrast
Comparison: Previous exam(s).

CLINICAL DATA: Screening.

EXAM:
DIGITAL SCREENING BILATERAL MAMMOGRAM WITH CAD

[L MLO]
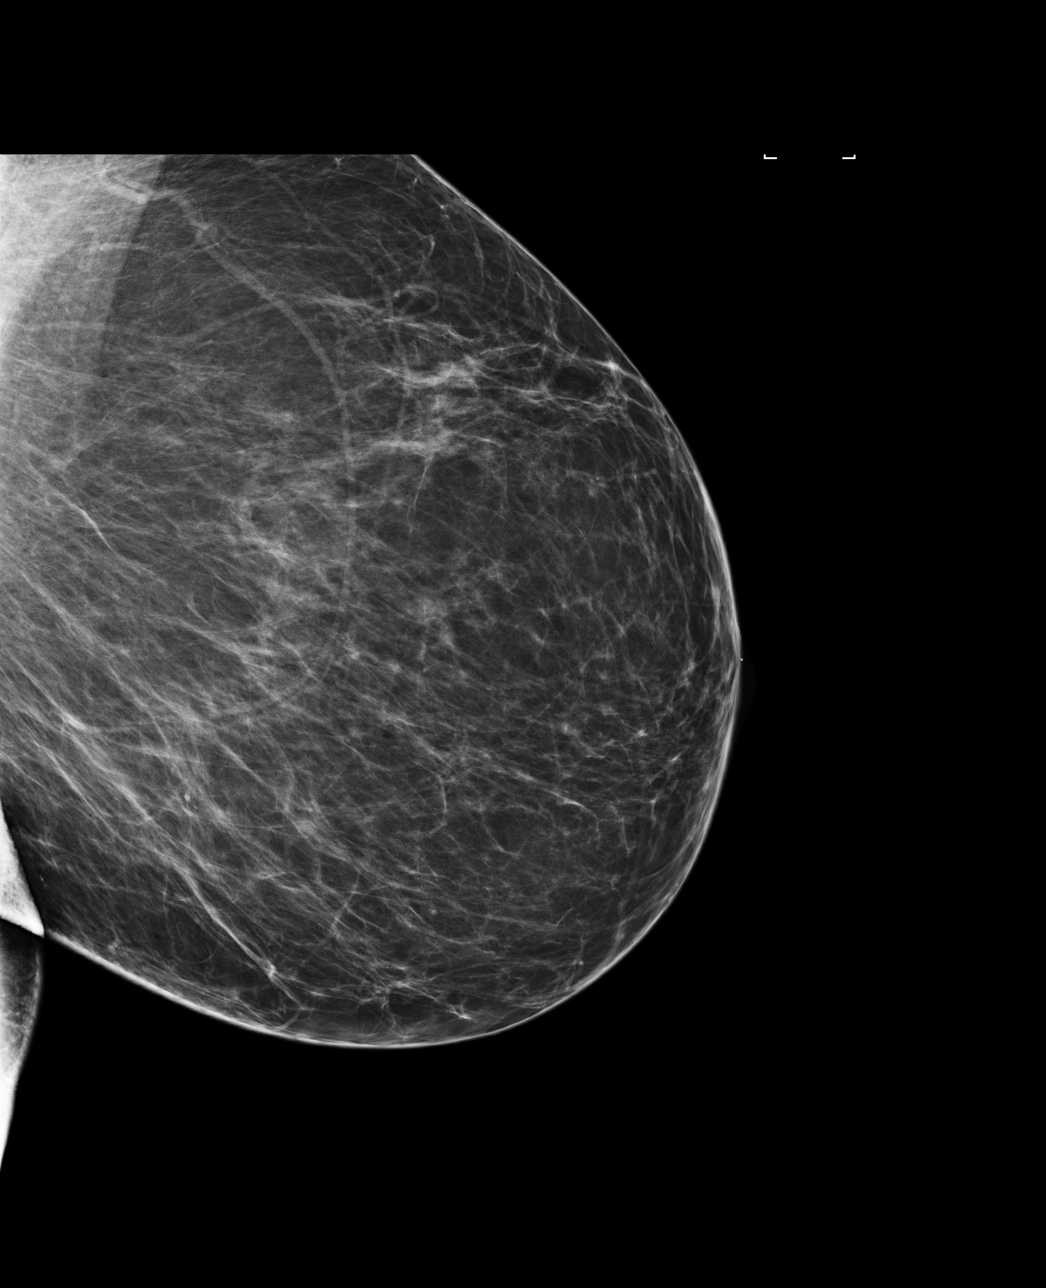

[L CC]
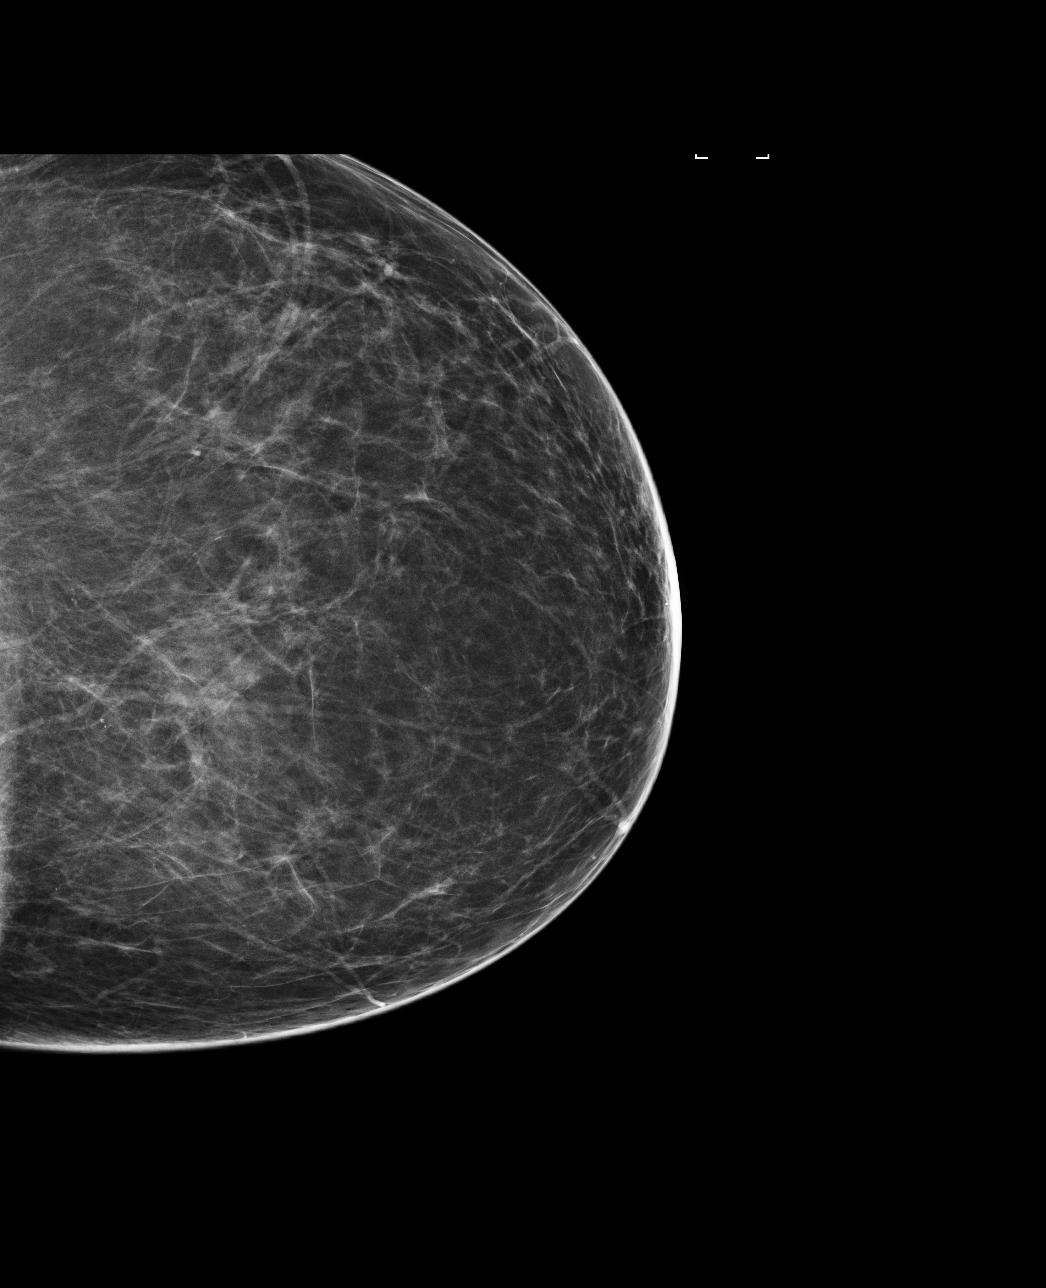

[R CC]
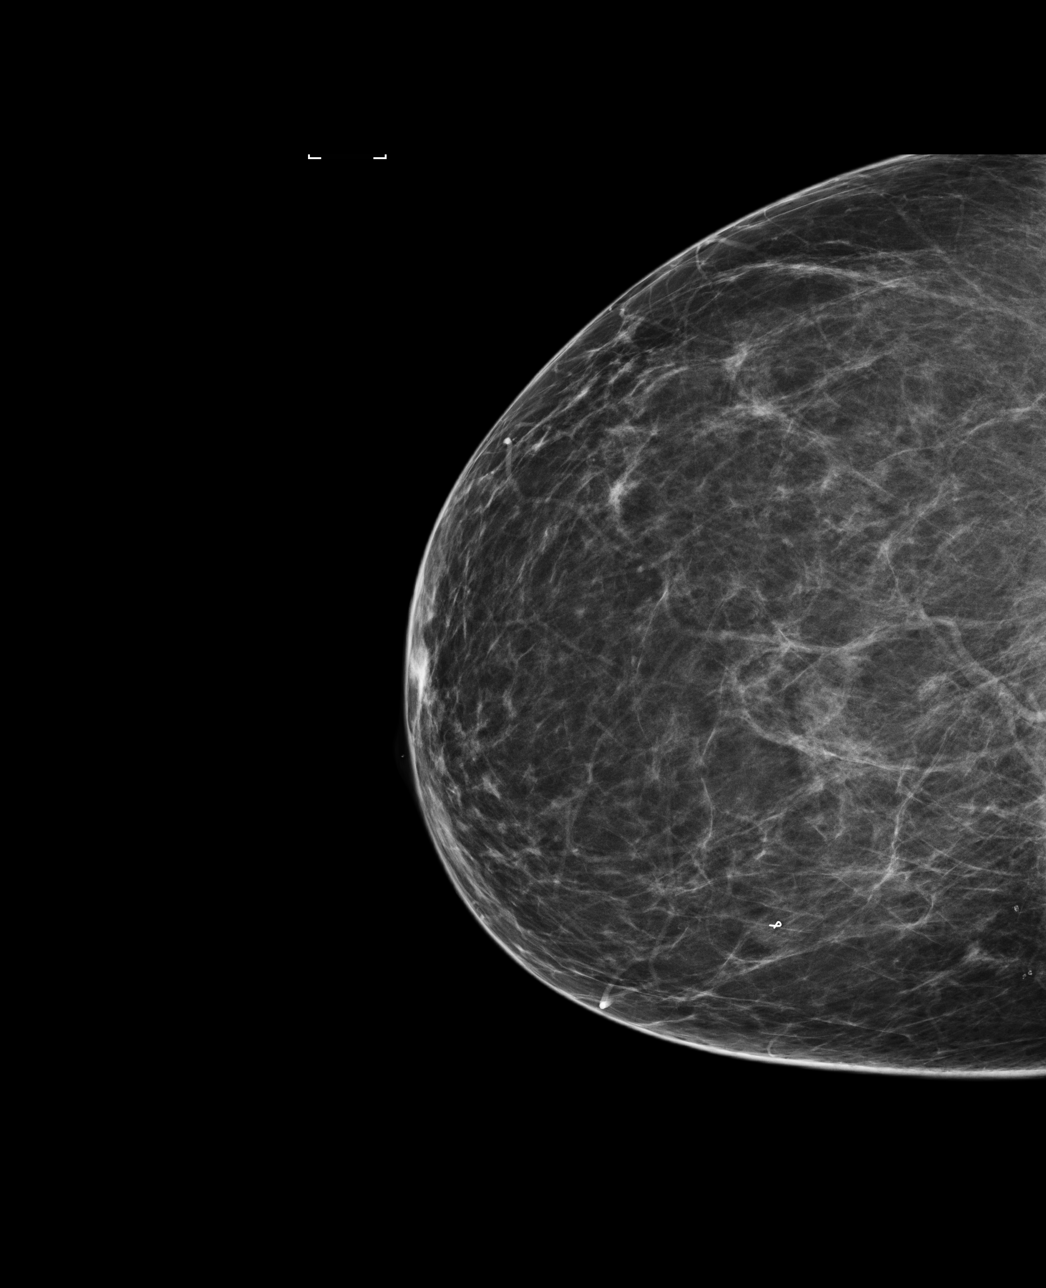

[R MLO]
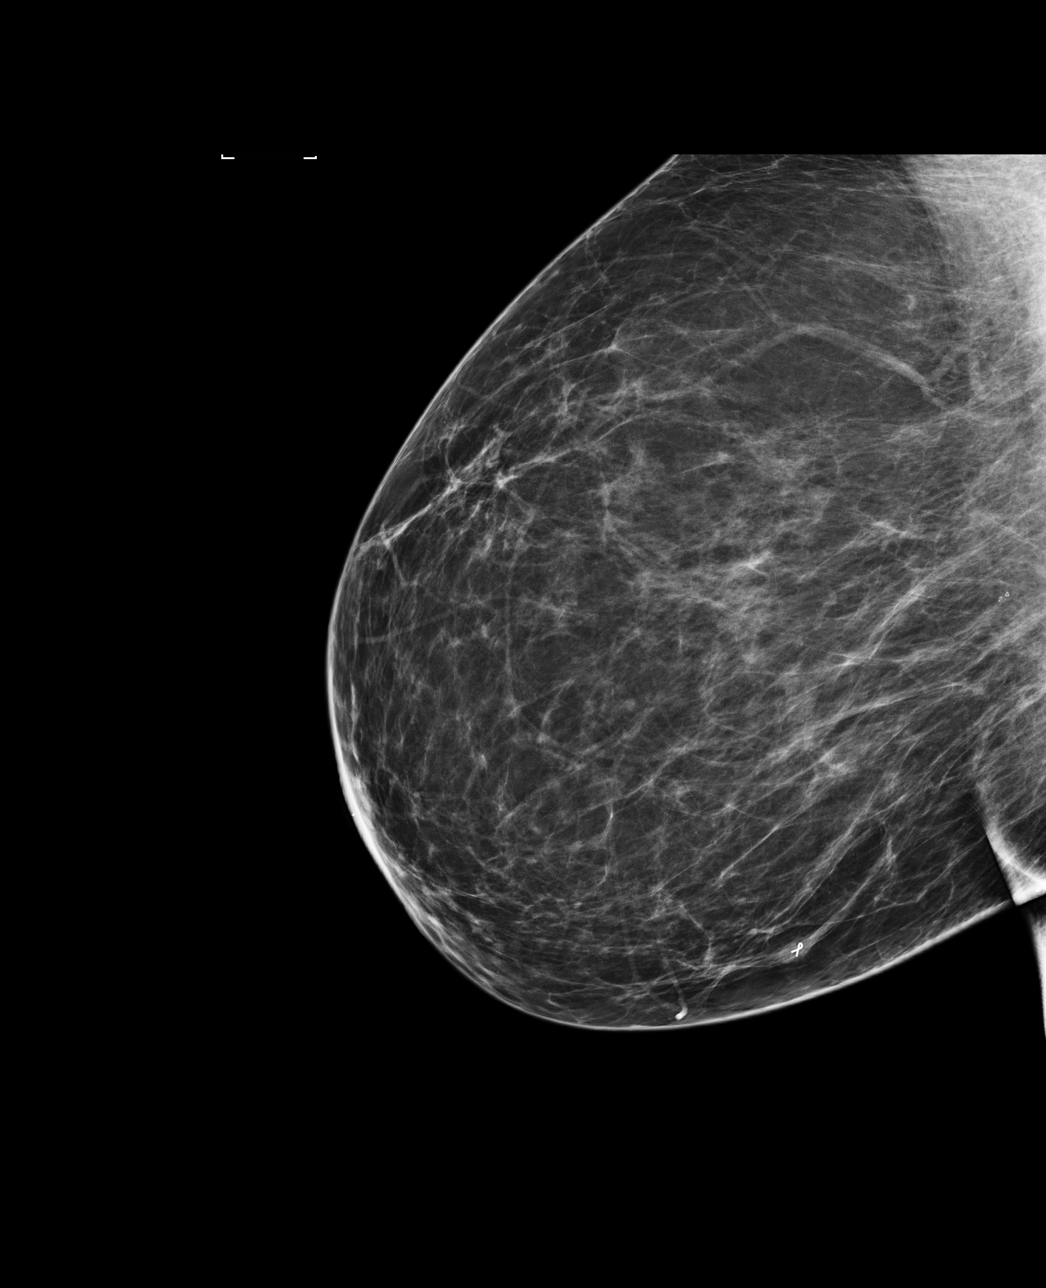

[4 of 4 positions shown; findings below may reference images not displayed]

ACR Breast Density Category b: There are scattered areas of
fibroglandular density.
FINDINGS: There are no findings suspicious for malignancy. Images were
processed with CAD.
IMPRESSION: No mammographic evidence of malignancy. A result letter of this
screening mammogram will be mailed directly to the patient.

RECOMMENDATION:
Screening mammogram in one year. (Code:AS-G-LCT)

BI-RADS CATEGORY  1: Negative.

## 2021-01-25 DIAGNOSIS — R300 Dysuria: Secondary | ICD-10-CM | POA: Diagnosis not present

## 2021-02-16 DIAGNOSIS — C678 Malignant neoplasm of overlapping sites of bladder: Secondary | ICD-10-CM | POA: Diagnosis not present

## 2021-03-30 DIAGNOSIS — M1712 Unilateral primary osteoarthritis, left knee: Secondary | ICD-10-CM | POA: Diagnosis not present

## 2021-03-30 DIAGNOSIS — M25562 Pain in left knee: Secondary | ICD-10-CM | POA: Diagnosis not present

## 2021-05-04 ENCOUNTER — Ambulatory Visit
Admission: RE | Admit: 2021-05-04 | Discharge: 2021-05-04 | Disposition: A | Discharge status: Home / Self Care | Payer: Medicare PPO | Source: Ambulatory Visit | Attending: Internal Medicine | Admitting: Internal Medicine

## 2021-05-04 ENCOUNTER — Other Ambulatory Visit: Payer: Medicare PPO

## 2021-05-04 DIAGNOSIS — Z780 Asymptomatic menopausal state: Secondary | ICD-10-CM | POA: Diagnosis not present

## 2021-05-04 DIAGNOSIS — N941 Unspecified dyspareunia: Secondary | ICD-10-CM | POA: Diagnosis not present

## 2021-05-04 DIAGNOSIS — Z1231 Encounter for screening mammogram for malignant neoplasm of breast: Secondary | ICD-10-CM

## 2021-05-04 DIAGNOSIS — Z01419 Encounter for gynecological examination (general) (routine) without abnormal findings: Secondary | ICD-10-CM | POA: Diagnosis not present

## 2021-05-04 DIAGNOSIS — I10 Essential (primary) hypertension: Secondary | ICD-10-CM | POA: Diagnosis not present

## 2021-05-04 DIAGNOSIS — M85851 Other specified disorders of bone density and structure, right thigh: Secondary | ICD-10-CM | POA: Diagnosis not present

## 2021-05-04 DIAGNOSIS — E2839 Other primary ovarian failure: Secondary | ICD-10-CM

## 2021-05-05 DIAGNOSIS — H401131 Primary open-angle glaucoma, bilateral, mild stage: Secondary | ICD-10-CM | POA: Diagnosis not present

## 2021-05-05 DIAGNOSIS — Z961 Presence of intraocular lens: Secondary | ICD-10-CM | POA: Diagnosis not present

## 2021-05-05 DIAGNOSIS — H35372 Puckering of macula, left eye: Secondary | ICD-10-CM | POA: Diagnosis not present

## 2021-05-25 DIAGNOSIS — C678 Malignant neoplasm of overlapping sites of bladder: Secondary | ICD-10-CM | POA: Diagnosis not present

## 2021-09-14 DIAGNOSIS — C678 Malignant neoplasm of overlapping sites of bladder: Secondary | ICD-10-CM | POA: Diagnosis not present

## 2021-09-14 DIAGNOSIS — R350 Frequency of micturition: Secondary | ICD-10-CM | POA: Diagnosis not present

## 2021-11-09 DIAGNOSIS — Z23 Encounter for immunization: Secondary | ICD-10-CM | POA: Diagnosis not present

## 2021-11-09 DIAGNOSIS — E7800 Pure hypercholesterolemia, unspecified: Secondary | ICD-10-CM | POA: Diagnosis not present

## 2021-11-09 DIAGNOSIS — E2839 Other primary ovarian failure: Secondary | ICD-10-CM | POA: Diagnosis not present

## 2021-11-09 DIAGNOSIS — M47812 Spondylosis without myelopathy or radiculopathy, cervical region: Secondary | ICD-10-CM | POA: Diagnosis not present

## 2021-11-09 DIAGNOSIS — I493 Ventricular premature depolarization: Secondary | ICD-10-CM | POA: Diagnosis not present

## 2021-11-09 DIAGNOSIS — I10 Essential (primary) hypertension: Secondary | ICD-10-CM | POA: Diagnosis not present

## 2021-11-09 DIAGNOSIS — Z1331 Encounter for screening for depression: Secondary | ICD-10-CM | POA: Diagnosis not present

## 2021-11-09 DIAGNOSIS — Z0000 Encounter for general adult medical examination without abnormal findings: Secondary | ICD-10-CM | POA: Diagnosis not present

## 2021-12-14 DIAGNOSIS — C678 Malignant neoplasm of overlapping sites of bladder: Secondary | ICD-10-CM | POA: Diagnosis not present

## 2021-12-14 DIAGNOSIS — R8279 Other abnormal findings on microbiological examination of urine: Secondary | ICD-10-CM | POA: Diagnosis not present

## 2021-12-14 DIAGNOSIS — R82998 Other abnormal findings in urine: Secondary | ICD-10-CM | POA: Diagnosis not present

## 2021-12-14 DIAGNOSIS — R350 Frequency of micturition: Secondary | ICD-10-CM | POA: Diagnosis not present

## 2022-01-14 DIAGNOSIS — U071 COVID-19: Secondary | ICD-10-CM | POA: Diagnosis not present

## 2022-02-20 IMAGING — MG DIGITAL SCREENING BILAT W/ CAD
4 series · 4 of 4 positions shown · non-contrast
Comparison: Previous exam(s).

CLINICAL DATA: Screening.

EXAM:
DIGITAL SCREENING BILATERAL MAMMOGRAM WITH CAD
TECHNIQUE: Bilateral screening digital craniocaudal and mediolateral oblique
mammograms were obtained. The images were evaluated with
computer-aided detection.

[L MLO]
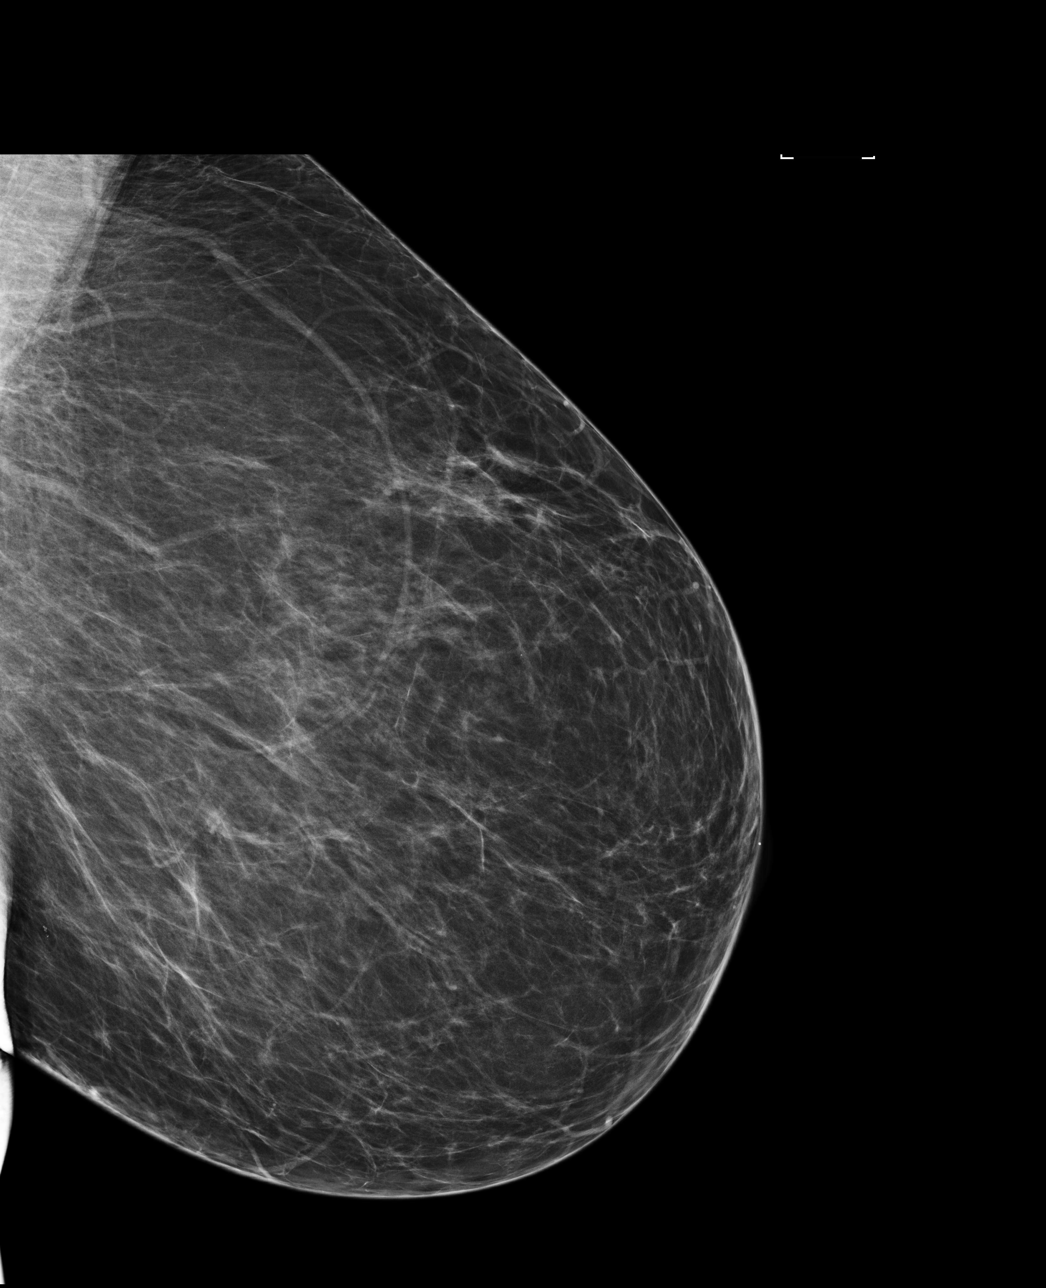

[L CC]
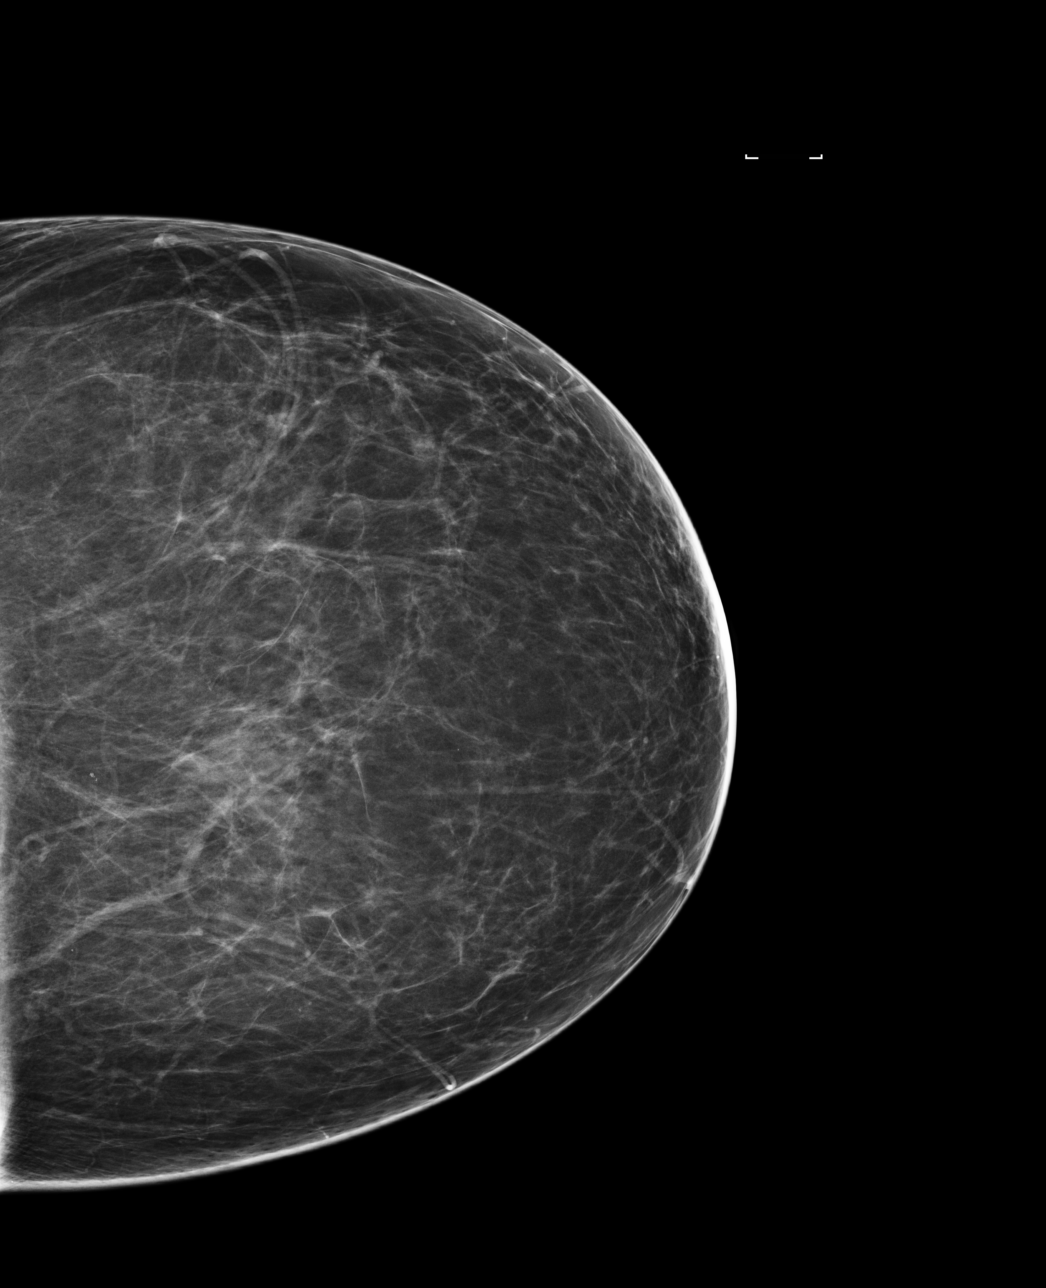

[R MLO]
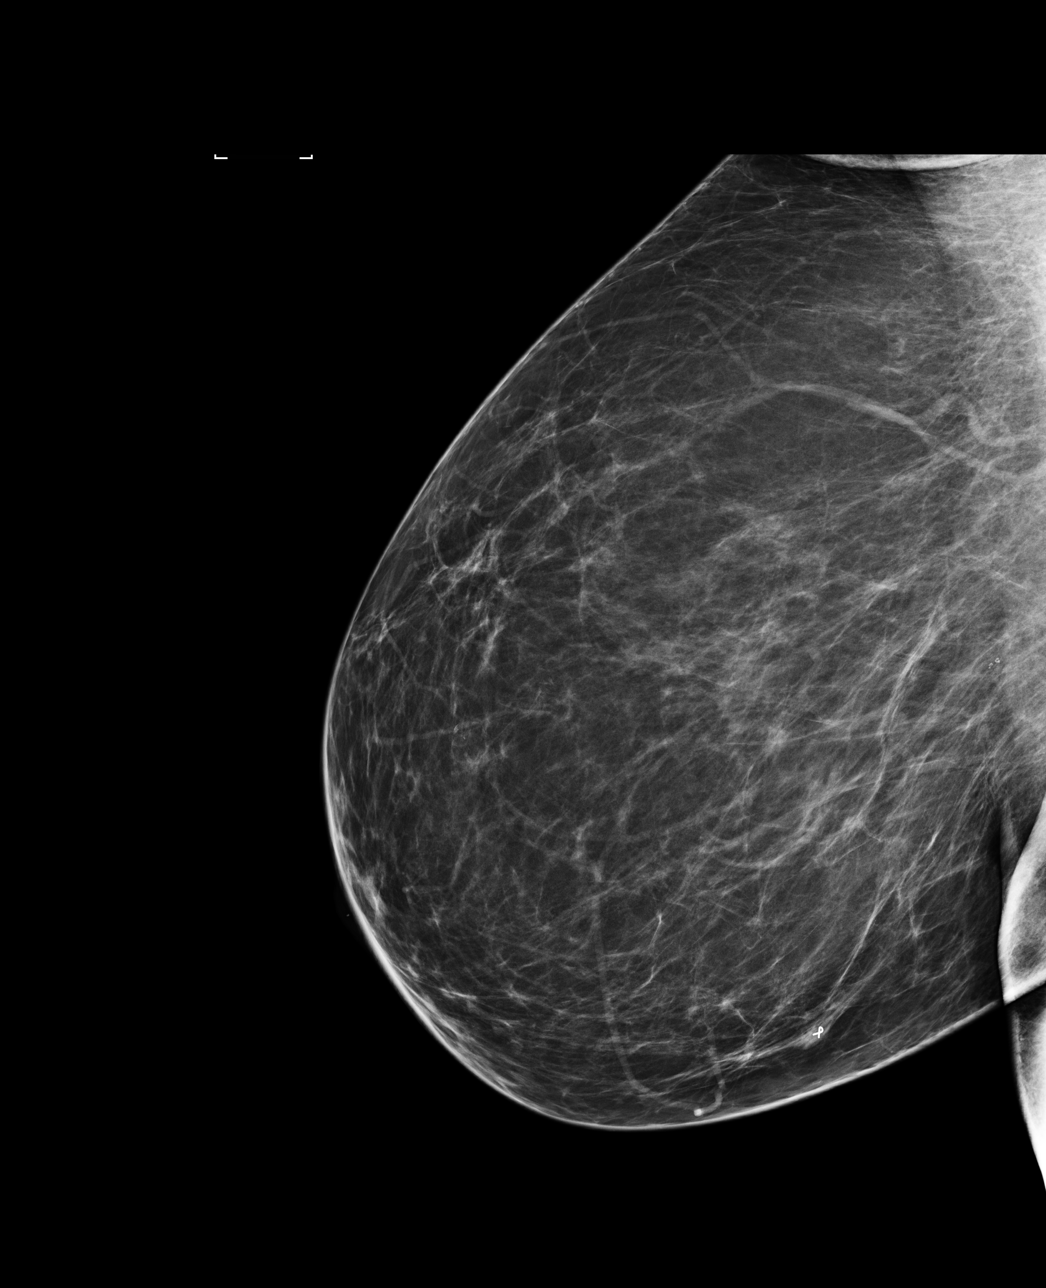

[R CC]
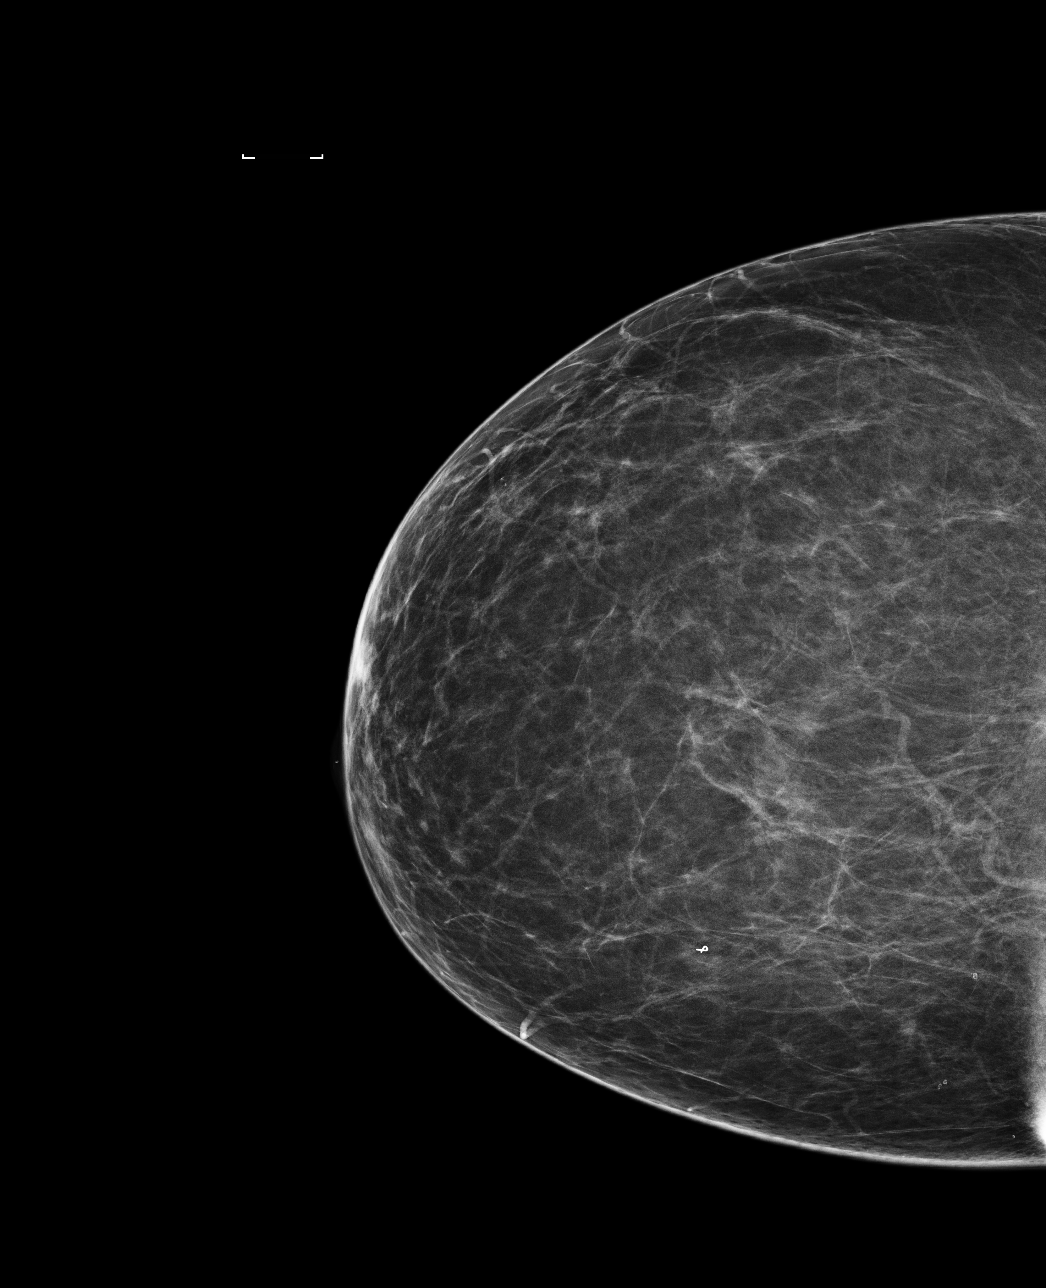

[4 of 4 positions shown; findings below may reference images not displayed]

ACR Breast Density Category b: There are scattered areas of
fibroglandular density.
FINDINGS: There are no findings suspicious for malignancy. The images were
evaluated with computer-aided detection.
IMPRESSION: No mammographic evidence of malignancy. A result letter of this
screening mammogram will be mailed directly to the patient.

RECOMMENDATION:
Screening mammogram in one year. (Code:XC-Q-XW6)

BI-RADS CATEGORY  1: Negative.

## 2022-03-18 ENCOUNTER — Other Ambulatory Visit: Payer: Self-pay | Admitting: Urology

## 2022-03-19 ENCOUNTER — Other Ambulatory Visit: Payer: Self-pay | Admitting: Urology

## 2022-03-30 ENCOUNTER — Encounter (HOSPITAL_BASED_OUTPATIENT_CLINIC_OR_DEPARTMENT_OTHER): Payer: Self-pay | Admitting: Urology

## 2022-03-30 NOTE — Progress Notes (Signed)
Spoke w/ via phone for pre-op interview---[facility] needs dos----    EKG per anesthesia           Lab results------ COVID test -----patient states asymptomatic no test needed Arrive at -------0700 NPO after MN NO Solid Food.  - Med rec completed Medications to take morning of surgery -----Norvasc Diabetic medication ----- Patient instructed no nail polish to be worn day of surgery Patient instructed to bring photo id and insurance card day of surgery Patient aware to have Driver (ride ) / caregiver Husband Kayla Barker   for 24 hours after surgery  Patient Special Instructions ----- Pre-Op special Istructions ----- Patient verbalized understanding of instructions that were given at this phone interview. Patient denies shortness of breath, chest pain, fever, cough at this phone interview.

## 2022-04-05 ENCOUNTER — Other Ambulatory Visit: Payer: Self-pay | Admitting: Physician Assistant

## 2022-04-05 DIAGNOSIS — Z1231 Encounter for screening mammogram for malignant neoplasm of breast: Secondary | ICD-10-CM

## 2022-04-05 NOTE — H&P (Signed)
was consulted to assess the patient's recurrent blood in the urine. She primarily sees it when she wipes. Two months ago she describes a visit to urgent care. No increased frequency or any pain. No smoking history. Does not take daily aspirin or blood thinner   At baseline I think she has mild urge incontinence and will wear a pad when she leaves the home. No stress incontinence.   She voids every 1 hour cannot hold it for 2 hours. She gets up at least 3 times a night. No ankle edema. No diuretic   patient has a history hematuria. She has a frequent bladder both day and night. She has little bit urge incontinence. After I work her up for hematuria I will ask her wetter treatment goals are for her overactive bladder. She will have a CT scan and return for pelvic and cystoscopy that was deferred for insurance reasons. Call if urine culture positive   Today  I did not treat the low count of Streptococcus. No CT scan done.  on cystoscopy the patient has multiple bladder tumors. There were more on the right posterior wall and floor the largest probably was approximately 2 cm or less. There was multiple small tumors all looks superficial. She did have some prominent blood vessels but the could be areas of carcinoma in situ or patches of redness  picture was drawn. Likely patient has superficial bladder cancer and possibly could have carcinoma in situ. She will get a CT scan on Monday and see my partner who will review trans urethral resections and biopsies  CLINICAL DATA: Hematuria   EXAM:  CT ABDOMEN AND PELVIS WITHOUT AND WITH CONTRAST   TECHNIQUE:  Multidetector CT imaging of the abdomen and pelvis was performed  following the standard protocol before and following the bolus  administration of intravenous contrast.   CONTRAST: 125 mL Omnipaque 300 IV   COMPARISON: None.   FINDINGS:  Lower chest: Lung bases are clear.   Hepatobiliary: Two subcentimeter cysts in the right hepatic dome   (series 5/image 10).   Gallbladder is underdistended but grossly unremarkable. No  intrahepatic or extrahepatic ductal dilatation.   Pancreas: Within normal limits.   Spleen: Within normal limits.   Adrenals/Urinary Tract: Adrenal glands are within normal limits.   8 mm cyst in the posterior interpolar right kidney (series 5/image  33). No enhancing renal lesions.   No renal, ureteral, or bladder calculi. No hydronephrosis.   On delayed imaging, there are no filling defects in the bilateral  renal collecting systems or ureters.   Polypoid filling defect in the right posterior bladder (series  9/image 79), with a corresponding 2.1 x 2.6 cm enhancing lesion  (series 5/image 79). Additional small polypoid enhancing lesion  along the left anterior bladder (series 5/image 75) raising concern  for multifocal disease.   Stomach/Bowel: Stomach is within normal limits.   No evidence of bowel obstruction.   Normal appendix (series 5/image 53).   Vascular/Lymphatic: No evidence of abdominal aortic aneurysm.   Atherosclerotic calcifications of the abdominal aorta and branch  vessels.   No suspicious abdominopelvic lymphadenopathy.   Reproductive: Status post supracervical hysterectomy.   No adnexal masses.   Other: No abdominopelvic ascites.   Musculoskeletal: Visualized osseous structures are within normal  limits.   IMPRESSION:  2.6 cm enhancing lesion along the right posterior bladder,  suspicious for primary bladder neoplasm. Possible additional lesion  in the left anterior bladder. Cystoscopy is suggested.   No findings suspicious for  upper tract or metastatic disease.   These results will be called to the ordering clinician or  representative by the Radiologist Assistant, and communication  documented in the PACS or [facility] Dashboard.  -11/30/19-patient is a 72 year old female previously followed by Dr.MacDiarmid all with history of hematuria. Patient underwent  cystoscopy which showed multiple papillary tumors within the urinary bladder. Also has had a CT scan subsequently showing centrally normal upper urinary tract but filling defects within the urinary bladder as seen on cysto. Now here for follow-up discussion regarding management of her bladder tumors.   -12/24/19-patient with history of recent diagnosis of bladder cancer. Underwent cysto retrogrades and TURBT on 12/18/2019. Patient had a fairly large tumor on the right lateral bladder wall and several other tumors throughout the bladder all were resected. Pathology shows high-grade urothelial carcinoma with involvement superficial lamina propria and no evidence of muscularis invasion. Muscle was present on the specimen. Patient has indwelling Foley. Here now for Foley removal and to discuss next steps of management. Patient has had some urgency as expected. Urine has cleared.  -05/05/20-patient with history of high-grade urothelial carcinoma stage T1 status post TURBT 12/18/2019. She is finished 6 week induction course of BCG from 02/05/2020 through 02/26/2020. Tolerated the therapy satisfactorily. Here for follow-up surveillance cysto.  Cystoscopy is performed today shows 1 small erythematous flat lesion in the center posterior bladder wall which measures less than a 0.5 cm there is no evidence of other lesions and the previous resection site is well-healed  -08/05/20-patient with history of high-grade urothelial carcinoma with involvement of superficial lamina propria. TURBT was 12/18/2019. Showed underwent 6 week induction BCG from 02/05/2020 through 02/26/2020. Had negative surveillance cysto in March of 2022. No interim GU issues. Here for follow-up surveillance cysto.  Cysto today is performed and shows: Shows no evidence of recurrent mucosal lesions. The previously seen erythematous area in the posterior aspect of the bladder in March of 2022 is no longer present. Previous resection sites smooth in scar  noted.  Micro urinalysis today is clear on urine spun sediment  -11/03/20-patient with history of high-grade urothelial carcinoma diagnosed via TURBT in November 2021 as a above. Underwent BCG induction therapy completed 02/26/2020. No interim GU issues. Here for follow-up surveillance cysto.  Cysto is performed today and shows: No evidence of obvious recurrent bladder lesions. There is a mild area of hypervascularity in the left lateral floor of the bladder on the left but has very low risk of recurrence.  Micro urinalysis  -02/16/21-patient with history of high-grade urothelial carcinoma diagnosed via TURBT in November 2021. Subsequently underwent BCG induction therapy completed in January 2022. Had negative surveillance cystoscopy in September 2022. No interim GU issues. Here for follow-up surveillance cystoscopy.  Cystoscopy is performed today and shows pristine bladder without evidence of mucosal lesions. Prior TURBT sites are smooth and devoid of tumor  -05/24/21-patient with history of high-grade urothelial carcinoma diagnosed via TURBT in November 2021. Underwent postprocedural BCG induction therapy completed in January 2022 and has had negative surveillance cystoscopy since that time. No interim GU issues. Here for follow-up surveillance cystoscopy.  Micro urinalysis clear on urine spun sediment  Cystoscopy performed today and shows: No obvious recurrent mucosal lesions. There is 1 small patchy erythematous area in the left floor the bladder which is nonraised and I think is low risk for recurrence. No other lesions noted.  -09/14/21-patient with history of high-grade urothelial carcinoma diagnosed via TURBT in November 2021. Finished BCG induction therapy in January 2022 and  has had negative surveillance cystoscopy until April 2023 where there is a small patchy erythematous area. Cytologies were negative and we elected to follow. Here now for 73-monthfollow-up surveillance cystoscopy. No interim gross  hematuria.  Micro urinalysis is clear on urine spun sediment  Cystoscopy: Bladder was inspected and shows no obvious recurrent mucosal lesions. There is some mild erythema on the posterior floor of the bladder which I think is hypervascularity but is not raised and has low suspicion for recurrence..  -12/14/21-patient with history of high-grade urothelial carcinoma diagnosed via TURBT in November 2021. Underwent BCG induction therapy completed in January 2022 and has had essentially negative surveillance cystoscopy until April 23 where we noted a small erythematous area in the base of the bladder which has been stable and thought to be more hypervascularity than risk for tumor. Cytologies have been negative. Here for follow-up surveillance cystoscopy. No interim GU issues.  Micro urinalysis is clear on urine spun sediment  Cystoscopy is performed today and shows:Bladder was inspected again reveals a likely hypervascular area in the left floor of the bladder which is unchanged and nonraised I think he has low suspicion for malignancy. The previous TURBT site is smooth and devoid of tumor.  -03/15/22-patient with history of high-grade urothelial carcinoma diagnosed via TURBT in November 2021. Underwent BCG induction therapy completed in January 2022 has had negative surveillance cystoscopy is until April 23 where there is a small erythematous area at the base of the bladder cytologies have been negative and we opted for surveillance. Here now for follow-up surveillance cystoscopy. No interim GU issues or hematuria.  Micro urinalysis is clear on urine spun sediment  Cystoscopy is performed today and shows: Thematous velvety appearing area in the left posterior bladder wall. Seems to be slightly raised and suspicious for possible CIS. The previous TURBT site is smooth and devoid of tumor     ALLERGIES: No Allergies    MEDICATIONS: Amlodipine Besylate 5 mg tablet 1 tablet PO Daily  Latanoprost 0.005 % drops 1  PO Daily  Timolol Maleate 0.5 % drops 1 PO Daily     GU PSH: Bladder Instill AntiCA Agent - 2022, 2022, 2022, 02/05/2020, 01/29/2020, 01/22/2020 Cystoscopy - 12/14/2021, 09/14/2021, 05/25/2021, 02/16/2021, 11/03/2020, 08/04/2020, 05/05/2020, 11/09/2019 Cystoscopy TURBT >5 cm - 12/18/2019 Locm 300-'399Mg'$ /Ml Iodine,1Ml - 11/12/2019     NON-GU PSH: No Non-GU PSH    GU PMH: Bladder Cancer overlapping sites - 12/14/2021, - 09/14/2021, - 05/25/2021, - 02/16/2021, - 11/03/2020, - 08/04/2020, - 05/05/2020, - 2022, - 2022, - 2022, - 02/05/2020, - 01/29/2020, - 01/22/2020, - 01/21/2020 (Stable), - 12/24/2019, - 11/30/2019 Urinary Frequency - 12/14/2021, - 09/14/2021, - 11/09/2019, - 10/30/2019 Gross hematuria - 11/30/2019, - 11/12/2019, - 11/09/2019, - 10/30/2019 Urge incontinence - 10/30/2019    NON-GU PMH: No Non-GU PMH    FAMILY HISTORY: No Family History    SOCIAL HISTORY: Marital Status: Married Preferred Language: English; Ethnicity: Not Hispanic Or Latino; Race: Black or African American Current Smoking Status: Patient has never smoked.   Tobacco Use Assessment Completed: Used Tobacco in last 30 days? Does not use smokeless tobacco. Does drink.  Does not use drugs. Drinks 2 caffeinated drinks per day. Has not had a blood transfusion.    REVIEW OF SYSTEMS:    GU Review Female:   Patient denies frequent urination, hard to postpone urination, burning /pain with urination, get up at night to urinate, leakage of urine, stream starts and stops, trouble starting your stream, have to strain to urinate,  and being pregnant.  Gastrointestinal (Upper):   Patient denies vomiting, nausea, and indigestion/ heartburn.  Gastrointestinal (Lower):   Patient denies diarrhea and constipation.  Constitutional:   Patient denies fever, night sweats, weight loss, and fatigue.  Skin:   Patient denies skin rash/ lesion and itching.  Eyes:   Patient denies blurred vision and double vision.  Ears/ Nose/ Throat:   Patient denies sore  throat and sinus problems.  Hematologic/Lymphatic:   Patient denies swollen glands and easy bruising.  Cardiovascular:   Patient denies leg swelling and chest pains.  Respiratory:   Patient denies cough and shortness of breath.  Endocrine:   Patient denies excessive thirst.  Musculoskeletal:   Patient denies back pain and joint pain.  Neurological:   Patient denies headaches and dizziness.  Psychologic:   Patient denies depression and anxiety.   VITAL SIGNS: None   GU PHYSICAL EXAMINATION:    Breast: Symmetrical. No tenderness, no nipple discharge, no skin changes. No mass.  Digital Rectal Exam: Normal sphincter tone. No rectal mass.  External Genitalia: No hirsutism, no rash, no scarring, no cyst, no erythematous lesion, no papular lesion, no blanched lesion, no warty lesion. No edema.  Urethral Meatus: Normal size. Normal position. No discharge.  Urethra: No tenderness, no mass, no scarring. No hypermobility. No leakage.  Bladder: Normal to palpation, no tenderness, no mass, normal size.  Vagina: No atrophy, no stenosis. No rectocele. No cystocele. No enterocele.  Cervix: No inflammation, no discharge, no lesion, no tenderness, no wart.   MULTI-SYSTEM PHYSICAL EXAMINATION:    Constitutional: Well-nourished. No physical deformities. Normally developed. Good grooming.  Neck: Neck symmetrical, not swollen. Normal tracheal position.  Respiratory: No labored breathing, no use of accessory muscles.   Cardiovascular: Normal temperature, normal extremity pulses, no swelling, no varicosities.  Lymphatic: No enlargement of neck, axillae, groin.  Skin: No paleness, no jaundice, no cyanosis. No lesion, no ulcer, no rash.  Neurologic / Psychiatric: Oriented to time, oriented to place, oriented to person. No depression, no anxiety, no agitation.  Eyes: Normal conjunctivae. Normal eyelids.  Ears, Nose, Mouth, and Throat: Left ear no scars, no lesions, no masses. Right ear no scars, no lesions, no  masses. Nose no scars, no lesions, no masses. Normal hearing. Normal lips.  Musculoskeletal: Normal gait and station of head and neck.     PAST DATA REVIEW: None   PROCEDURES:         Flexible Cystoscopy - 52000  Risks, benefits, and some of the potential complications of the procedure were discussed at length with the patient including infection, bleeding, voiding discomfort, urinary retention, fever, chills, sepsis, and others. All questions were answered. Informed consent was obtained. Antibiotic prophylaxis was given. Sterile technique and intraurethral analgesia were used.  Meatus:  Normal size. Normal location. Normal condition.  Urethra:  No hypermobility. No leakage.  Ureteral Orifices:  Normal location. Normal size. Normal shape. Effluxed clear urine.  Bladder:  Cystoscopy is performed today and shows: Thematous velvety appearing area in the left posterior bladder wall. Seems to be slightly raised and suspicious for possible CIS. The previous TURBT site is smooth and devoid of tumo      The lower urinary tract was carefully examined. The procedure was well-tolerated and without complications. Antibiotic instructions were given. Instructions were given to call the office immediately for bloody urine, difficulty urinating, urinary retention, painful or frequent urination, fever, chills, nausea, vomiting or other illness. The patient stated that she understood these instructions and would comply  with them.         Urinalysis - 81003 Dipstick Dipstick Cont'd  Color: Yellow Bilirubin: Neg mg/dL  Appearance: Clear Ketones: Neg mg/dL  Specific Gravity: 1.015 Blood: Neg ery/uL  pH: 7.0 Protein: Trace mg/dL  Glucose: Neg mg/dL Urobilinogen: 0.2 mg/dL    Nitrites: Neg    Leukocyte Esterase: Neg leu/uL    ASSESSMENT:      ICD-10 Details  1 GU:   Bladder Cancer overlapping sites - 123XX123 Acute, Complicated Injury   PLAN:           Document Letter(s):  Created for Patient: Clinical  Summary         Notes:   I discussed cystoscopic findings and patient was able to see the findings on video monitor today. Recommended cystoscopy bladder biopsy/TURBT with fulguration and will also do retrogrades same time. Risk and benefits procedure were discussed in detail. Will see back after the biopsy to discuss further disposition.  TURBT consent: I have discussed with the patient the risks, benefits of TURBT which include but are not limited to: Bleeding, infection, damage to the bladder with potential perforation of the bladder, damage to surrounding organs, possible need for further procedures including open repair and catheterization, possibility of nonhealing area within the bladder, urgency, frequency which may be refractory to medications. I pointed out that in some occasions after resection of the bladder tumor, mitomycin-C chemotherapy may be instilled into the bladder. The risks associated with this therapy include but are not limited to: Refractory or new onset urgency, frequency, dysuria, infrequently severe systemic side effects secondary to mitomycin-C. After full discussion of the risks, benefits and alternatives, the patient has consented to the above procedure and desires to proceed.

## 2022-04-06 ENCOUNTER — Ambulatory Visit (HOSPITAL_BASED_OUTPATIENT_CLINIC_OR_DEPARTMENT_OTHER): Payer: Medicare PPO | Admitting: Anesthesiology

## 2022-04-06 ENCOUNTER — Encounter (HOSPITAL_BASED_OUTPATIENT_CLINIC_OR_DEPARTMENT_OTHER): Payer: Self-pay | Admitting: Urology

## 2022-04-06 ENCOUNTER — Other Ambulatory Visit: Payer: Self-pay

## 2022-04-06 ENCOUNTER — Encounter (HOSPITAL_BASED_OUTPATIENT_CLINIC_OR_DEPARTMENT_OTHER)
Admission: RE | Disposition: A | Discharge status: Home / Self Care | Payer: Self-pay | Source: Home / Self Care | Attending: Urology

## 2022-04-06 ENCOUNTER — Ambulatory Visit (HOSPITAL_BASED_OUTPATIENT_CLINIC_OR_DEPARTMENT_OTHER)
Admission: RE | Admit: 2022-04-06 | Discharge: 2022-04-06 | Disposition: A | Discharge status: Home / Self Care | Payer: Medicare PPO | Attending: Urology | Admitting: Urology

## 2022-04-06 DIAGNOSIS — I10 Essential (primary) hypertension: Secondary | ICD-10-CM | POA: Insufficient documentation

## 2022-04-06 DIAGNOSIS — N329 Bladder disorder, unspecified: Secondary | ICD-10-CM | POA: Diagnosis not present

## 2022-04-06 DIAGNOSIS — Z8551 Personal history of malignant neoplasm of bladder: Secondary | ICD-10-CM

## 2022-04-06 DIAGNOSIS — N3289 Other specified disorders of bladder: Secondary | ICD-10-CM | POA: Diagnosis present

## 2022-04-06 DIAGNOSIS — C678 Malignant neoplasm of overlapping sites of bladder: Secondary | ICD-10-CM | POA: Insufficient documentation

## 2022-04-06 DIAGNOSIS — Z79899 Other long term (current) drug therapy: Secondary | ICD-10-CM | POA: Diagnosis not present

## 2022-04-06 HISTORY — PX: TRANSURETHRAL RESECTION OF BLADDER TUMOR: SHX2575

## 2022-04-06 HISTORY — PX: CYSTOSCOPY W/ RETROGRADES: SHX1426

## 2022-04-06 SURGERY — CYSTOSCOPY, WITH RETROGRADE PYELOGRAM
Anesthesia: General | Laterality: Bilateral

## 2022-04-06 MED ORDER — LIDOCAINE HCL (PF) 2 % IJ SOLN
INTRAMUSCULAR | Status: AC
  Filled 2022-04-06: qty 5

## 2022-04-06 MED ORDER — LACTATED RINGERS IV SOLN: INTRAVENOUS | Status: DC

## 2022-04-06 MED ORDER — LIDOCAINE 2% (20 MG/ML) 5 ML SYRINGE
INTRAMUSCULAR | Status: DC | PRN
  Administered 2022-04-06: 50 mg via INTRAVENOUS

## 2022-04-06 MED ORDER — ACETAMINOPHEN 500 MG PO TABS
ORAL_TABLET | ORAL | Status: AC
  Filled 2022-04-06: qty 2

## 2022-04-06 MED ORDER — FENTANYL CITRATE (PF) 100 MCG/2ML IJ SOLN
INTRAMUSCULAR | Status: DC | PRN
  Administered 2022-04-06: 50 ug via INTRAVENOUS

## 2022-04-06 MED ORDER — ACETAMINOPHEN 500 MG PO TABS
1000.0000 mg | ORAL_TABLET | Freq: Once | ORAL | Status: AC
  Administered 2022-04-06: 1000 mg via ORAL

## 2022-04-06 MED ORDER — MIDAZOLAM HCL 2 MG/2ML IJ SOLN
INTRAMUSCULAR | Status: AC
  Filled 2022-04-06: qty 2

## 2022-04-06 MED ORDER — PROPOFOL 10 MG/ML IV BOLUS
INTRAVENOUS | Status: AC
  Filled 2022-04-06: qty 20

## 2022-04-06 MED ORDER — DEXAMETHASONE SODIUM PHOSPHATE 10 MG/ML IJ SOLN
INTRAMUSCULAR | Status: DC | PRN
  Administered 2022-04-06: 5 mg via INTRAVENOUS

## 2022-04-06 MED ORDER — DEXAMETHASONE SODIUM PHOSPHATE 10 MG/ML IJ SOLN
INTRAMUSCULAR | Status: AC
  Filled 2022-04-06: qty 1

## 2022-04-06 MED ORDER — STERILE WATER FOR IRRIGATION IR SOLN
Status: DC | PRN
  Administered 2022-04-06: 3000 mL

## 2022-04-06 MED ORDER — SUGAMMADEX SODIUM 200 MG/2ML IV SOLN
INTRAVENOUS | Status: DC | PRN
  Administered 2022-04-06: 200 mg via INTRAVENOUS

## 2022-04-06 MED ORDER — PHENYLEPHRINE 80 MCG/ML (10ML) SYRINGE FOR IV PUSH (FOR BLOOD PRESSURE SUPPORT)
PREFILLED_SYRINGE | INTRAVENOUS | Status: DC | PRN
  Administered 2022-04-06: 80 ug via INTRAVENOUS
  Administered 2022-04-06: 160 ug via INTRAVENOUS

## 2022-04-06 MED ORDER — ROCURONIUM BROMIDE 10 MG/ML (PF) SYRINGE
PREFILLED_SYRINGE | INTRAVENOUS | Status: AC
  Filled 2022-04-06: qty 10

## 2022-04-06 MED ORDER — CEFAZOLIN SODIUM-DEXTROSE 2-4 GM/100ML-% IV SOLN
INTRAVENOUS | Status: AC
  Filled 2022-04-06: qty 100

## 2022-04-06 MED ORDER — PROPOFOL 10 MG/ML IV BOLUS
INTRAVENOUS | Status: DC | PRN
  Administered 2022-04-06: 120 mg via INTRAVENOUS

## 2022-04-06 MED ORDER — FENTANYL CITRATE (PF) 100 MCG/2ML IJ SOLN
INTRAMUSCULAR | Status: AC
  Filled 2022-04-06: qty 2

## 2022-04-06 MED ORDER — PHENYLEPHRINE 80 MCG/ML (10ML) SYRINGE FOR IV PUSH (FOR BLOOD PRESSURE SUPPORT)
PREFILLED_SYRINGE | INTRAVENOUS | Status: AC
  Filled 2022-04-06: qty 20

## 2022-04-06 MED ORDER — ROCURONIUM BROMIDE 10 MG/ML (PF) SYRINGE
PREFILLED_SYRINGE | INTRAVENOUS | Status: DC | PRN
  Administered 2022-04-06: 40 mg via INTRAVENOUS

## 2022-04-06 MED ORDER — ONDANSETRON HCL 4 MG/2ML IJ SOLN
INTRAMUSCULAR | Status: AC
  Filled 2022-04-06: qty 2

## 2022-04-06 MED ORDER — FENTANYL CITRATE (PF) 100 MCG/2ML IJ SOLN: 25.0000 ug | INTRAMUSCULAR | Status: DC | PRN

## 2022-04-06 MED ORDER — TRAMADOL HCL 50 MG PO TABS: 50.0000 mg | ORAL_TABLET | Freq: Four times a day (QID) | ORAL | 0 refills | Status: AC | PRN

## 2022-04-06 MED ORDER — CEFAZOLIN SODIUM-DEXTROSE 2-4 GM/100ML-% IV SOLN
2.0000 g | INTRAVENOUS | Status: AC
  Administered 2022-04-06: 2 g via INTRAVENOUS

## 2022-04-06 MED ORDER — IOHEXOL 300 MG/ML  SOLN
INTRAMUSCULAR | Status: DC | PRN
  Administered 2022-04-06: 20 mL via URETHRAL

## 2022-04-06 MED ORDER — MIDAZOLAM HCL 5 MG/5ML IJ SOLN
INTRAMUSCULAR | Status: DC | PRN
  Administered 2022-04-06: 2 mg via INTRAVENOUS

## 2022-04-06 MED ORDER — ONDANSETRON HCL 4 MG/2ML IJ SOLN
INTRAMUSCULAR | Status: DC | PRN
  Administered 2022-04-06: 4 mg via INTRAVENOUS

## 2022-04-06 SURGICAL SUPPLY — 21 items
BAG DRAIN URO-CYSTO SKYTR STRL (DRAIN) ×3 IMPLANT
BAG DRN UROCATH (DRAIN) ×2
CATH URETL OPEN 5X70 (CATHETERS) IMPLANT
CLOTH BEACON ORANGE TIMEOUT ST (SAFETY) ×3 IMPLANT
ELECT REM PT RETURN 9FT ADLT (ELECTROSURGICAL) ×2
ELECTRODE REM PT RTRN 9FT ADLT (ELECTROSURGICAL) ×3 IMPLANT
GLOVE BIO SURGEON STRL SZ 6.5 (GLOVE) IMPLANT
GLOVE BIO SURGEON STRL SZ7.5 (GLOVE) ×3 IMPLANT
GLOVE SURG SS PI 6.5 STRL IVOR (GLOVE) IMPLANT
GOWN STRL REUS W/ TWL LRG LVL3 (GOWN DISPOSABLE) IMPLANT
GOWN STRL REUS W/TWL LRG LVL3 (GOWN DISPOSABLE) ×5 IMPLANT
GUIDEWIRE STR DUAL SENSOR (WIRE) IMPLANT
IV NS IRRIG 3000ML ARTHROMATIC (IV SOLUTION) ×3 IMPLANT
KIT TURNOVER CYSTO (KITS) ×3 IMPLANT
MANIFOLD NEPTUNE II (INSTRUMENTS) IMPLANT
PACK CYSTO (CUSTOM PROCEDURE TRAY) ×3 IMPLANT
SLEEVE SCD COMPRESS KNEE MED (STOCKING) ×3 IMPLANT
SYR 20ML LL LF (SYRINGE) ×3 IMPLANT
TUBE CONNECTING 12X1/4 (SUCTIONS) IMPLANT
TUBING UROLOGY SET (TUBING) IMPLANT
WATER STERILE IRR 3000ML UROMA (IV SOLUTION) IMPLANT

## 2022-04-06 NOTE — Anesthesia Procedure Notes (Signed)
Procedure Name: Intubation Date/Time: 04/06/2022 9:16 AM  Performed by: Rogers Blocker, CRNAPre-anesthesia Checklist: Patient identified, Emergency Drugs available, Suction available and Patient being monitored Patient Re-evaluated:Patient Re-evaluated prior to induction Oxygen Delivery Method: Circle System Utilized Preoxygenation: Pre-oxygenation with 100% oxygen Induction Type: IV induction Ventilation: Mask ventilation without difficulty Laryngoscope Size: Mac and 3 Grade View: Grade I Tube type: Oral Tube size: 7.0 mm Number of attempts: 1 Airway Equipment and Method: Stylet Placement Confirmation: ETT inserted through vocal cords under direct vision, positive ETCO2 and breath sounds checked- equal and bilateral Secured at: 22 cm Tube secured with: Tape Dental Injury: Teeth and Oropharynx as per pre-operative assessment

## 2022-04-06 NOTE — Interval H&P Note (Signed)
History and Physical Interval Note:  04/06/2022 9:04 AM  Kayla Barker  has presented today for surgery, with the diagnosis of BLADDER CANCER.  The various methods of treatment have been discussed with the patient and family. After consideration of risks, benefits and other options for treatment, the patient has consented to  Procedure(s) with comments: TRANSURETHRAL RESECTION OF BLADDER TUMOR (TURBT) (N/A) - 30 MINS CYSTOSCOPY WITH RETROGRADE PYELOGRAM (Bilateral) as a surgical intervention.  The patient's history has been reviewed, patient examined, no change in status, stable for surgery.  I have reviewed the patient's chart and labs.  Questions were answered to the patient's satisfaction.     Remi Haggard

## 2022-04-06 NOTE — Transfer of Care (Signed)
Immediate Anesthesia Transfer of Care Note  Patient: Kayla Barker  Procedure(s) Performed: CYSTOSCOPY WITH RETROGRADE PYELOGRAM (Bilateral) TRANSURETHRAL RESECTION OF BLADDER TUMOR (TURBT) small  Patient Location: PACU  Anesthesia Type:General  Level of Consciousness: awake, alert , oriented, and patient cooperative  Airway & Oxygen Therapy: Patient Spontanous Breathing  Post-op Assessment: Report given to RN and Post -op Vital signs reviewed and stable  Post vital signs: Reviewed and stable  Last Vitals:  Vitals Value Taken Time  BP 144/76 04/06/22 0954  Temp    Pulse 67 04/06/22 0957  Resp 17 04/06/22 0957  SpO2 96 % 04/06/22 0957  Vitals shown include unvalidated device data.  Last Pain:  Vitals:   04/06/22 0739  TempSrc: Oral  PainSc: 0-No pain      Patients Stated Pain Goal: 4 ([id number])  Complications: No notable events documented.

## 2022-04-06 NOTE — Interval H&P Note (Signed)
History and Physical Interval Note:  04/06/2022 7:17 AM  Kayla Barker  has presented today for surgery, with the diagnosis of BLADDER CANCER.  The various methods of treatment have been discussed with the patient and family. After consideration of risks, benefits and other options for treatment, the patient has consented to  Procedure(s) with comments: TRANSURETHRAL RESECTION OF BLADDER TUMOR (TURBT) (N/A) - 30 MINS CYSTOSCOPY WITH RETROGRADE PYELOGRAM (Bilateral) as a surgical intervention.  The patient's history has been reviewed, patient examined, no change in status, stable for surgery.  I have reviewed the patient's chart and labs.  Questions were answered to the patient's satisfaction.     Remi Haggard

## 2022-04-06 NOTE — Anesthesia Preprocedure Evaluation (Signed)
Anesthesia Evaluation  Patient identified by MRN, date of birth, ID band Patient awake    Reviewed: Allergy & Precautions, NPO status , Patient's Chart, lab work & pertinent test results  Airway Mallampati: II  TM Distance: >3 FB Neck ROM: Full    Dental no notable dental hx.    Pulmonary neg pulmonary ROS   Pulmonary exam normal breath sounds clear to auscultation       Cardiovascular hypertension, Pt. on medications Normal cardiovascular exam Rhythm:Regular Rate:Normal     Neuro/Psych negative neurological ROS  negative psych ROS   GI/Hepatic negative GI ROS, Neg liver ROS,,,  Endo/Other  negative endocrine ROS    Renal/GU negative Renal ROS  negative genitourinary   Musculoskeletal negative musculoskeletal ROS (+)    Abdominal   Peds  Hematology negative hematology ROS (+)   Anesthesia Other Findings   Reproductive/Obstetrics                             Anesthesia Physical Anesthesia Plan  ASA: 2  Anesthesia Plan: General   Post-op Pain Management: Tylenol PO (pre-op)*   Induction: Intravenous  PONV Risk Score and Plan: 3 and Ondansetron, Dexamethasone and Treatment may vary due to age or medical condition  Airway Management Planned: LMA and Oral ETT  Additional Equipment:   Intra-op Plan:   Post-operative Plan: Extubation in OR  Informed Consent: I have reviewed the patients History and Physical, chart, labs and discussed the procedure including the risks, benefits and alternatives for the proposed anesthesia with the patient or authorized representative who has indicated his/her understanding and acceptance.     Dental advisory given  Plan Discussed with: CRNA  Anesthesia Plan Comments:        Anesthesia Quick Evaluation

## 2022-04-06 NOTE — Discharge Instructions (Signed)
  Post Anesthesia Home Care Instructions  Activity: Get plenty of rest for the remainder of the day. A responsible individual must stay with you for 24 hours following the procedure.  For the next 24 hours, DO NOT: -Drive a car -Paediatric nurse -Drink alcoholic beverages -Take any medication unless instructed by your physician -Make any legal decisions or sign important papers.  Meals: Start with liquid foods such as gelatin or soup. Progress to regular foods as tolerated. Avoid greasy, spicy, heavy foods. If nausea and/or vomiting occur, drink only clear liquids until the nausea and/or vomiting subsides. Call your physician if vomiting continues.  Special Instructions/Symptoms: Your throat may feel dry or sore from the anesthesia or the breathing tube placed in your throat during surgery. If this causes discomfort, gargle with warm salt water. The discomfort should disappear within 24 hours.      No acetaminophen/Tylenol until after 1:45 pm today if needed.

## 2022-04-06 NOTE — Anesthesia Postprocedure Evaluation (Signed)
Anesthesia Post Note  Patient: Kayla Barker  Procedure(s) Performed: CYSTOSCOPY WITH RETROGRADE PYELOGRAM (Bilateral) TRANSURETHRAL RESECTION OF BLADDER TUMOR (TURBT) small     Patient location during evaluation: PACU Anesthesia Type: General Level of consciousness: awake and alert Pain management: pain level controlled Vital Signs Assessment: post-procedure vital signs reviewed and stable Respiratory status: spontaneous breathing, nonlabored ventilation, respiratory function stable and patient connected to nasal cannula oxygen Cardiovascular status: blood pressure returned to baseline and stable Postop Assessment: no apparent nausea or vomiting Anesthetic complications: no  No notable events documented.  Last Vitals:  Vitals:   04/06/22 1030 04/06/22 1125  BP: 134/71 128/74  Pulse: (!) 58 (!) 59  Resp: 18 16  Temp:  (!) 36.2 C  SpO2: 94% 96%    Last Pain:  Vitals:   04/06/22 1125  TempSrc: Oral  PainSc: 2                  [name] L Shakiah Wester

## 2022-04-06 NOTE — Op Note (Signed)
Preoperative diagnosis:  1.  History of bladder cancer, recurrent bladder lesions  Postoperative diagnosis: 1.  Same  Procedure(s): 1.  Cystoscopy, bilateral retrograde pyelogram, transurethral section of bladder tumor (small)  Surgeon: Dr. Harold Barban  Anesthesia: General  Complications: None  EBL: Minimal  Specimens: Bladder tumor/bladder lesion  Disposition of specimens: To pathology  Intraoperative findings: Approximate 2 cm velvety red flat lesion left posterior bladder, removed utilizing rigid biopsy forceps.  Base fulgurated.  Bilateral retrogrades were normal  Indication: Patient is a 72 year old African-American female with history of high-grade bladder cancer has undergone intravesical BCG induction therapy as well.  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  Presents this time to go cystoscopy TURBT and plan with retrogrades.  Description of procedure:  After obtaining informed consent the patient was taken major cystoscopy suite placed under general anesthesia.  Placed in dorsolithotomy position genitalia prepped draped in a sterile fashion.  Proper pause and timeout was performed.  10 French cystoscope was advanced into the bladder without difficulty.  Of note findings with left posterior bladder lesions noted remainder of bladder appeared grossly normal.  Bilateral retrograde pyelograms were performed with 5 [place] cath revealed normal filling of both upper tracts without evidence of filling defect or hydronephrosis.  Both EMPTIED out promptly upon removal of the retrograde catheter.  Attention then directed towards removal of the bladder lesion.  This felt in order to preserve pathologic integrity that this lesion will be removed with rigid biopsy forceps.  Utilizing rigid biopsy forceps the area was completely removed and pathology sent to pathology for final  diagnosis.  Base of this area was fulgurated using Bugbee electrode with good hemostasis noted.  No remaining lesion noted.  Bladder was emptied the area reinspected with good hemostasis.  Procedure terminated she was awakened from anesthesia and taken back to recovery in stable condition.  No immediate complication from the procedure.

## 2022-04-07 ENCOUNTER — Encounter (HOSPITAL_BASED_OUTPATIENT_CLINIC_OR_DEPARTMENT_OTHER): Payer: Self-pay | Admitting: Urology

## 2022-04-08 LAB — SURGICAL PATHOLOGY

## 2022-05-10 DIAGNOSIS — I10 Essential (primary) hypertension: Secondary | ICD-10-CM | POA: Diagnosis not present

## 2022-05-10 DIAGNOSIS — C678 Malignant neoplasm of overlapping sites of bladder: Secondary | ICD-10-CM | POA: Diagnosis not present

## 2022-05-10 DIAGNOSIS — Z5111 Encounter for antineoplastic chemotherapy: Secondary | ICD-10-CM | POA: Diagnosis not present

## 2022-05-10 DIAGNOSIS — C679 Malignant neoplasm of bladder, unspecified: Secondary | ICD-10-CM | POA: Diagnosis not present

## 2022-05-10 DIAGNOSIS — R143 Flatulence: Secondary | ICD-10-CM | POA: Diagnosis not present

## 2022-05-12 DIAGNOSIS — H401131 Primary open-angle glaucoma, bilateral, mild stage: Secondary | ICD-10-CM | POA: Diagnosis not present

## 2022-05-12 DIAGNOSIS — Z961 Presence of intraocular lens: Secondary | ICD-10-CM | POA: Diagnosis not present

## 2022-05-12 DIAGNOSIS — H35372 Puckering of macula, left eye: Secondary | ICD-10-CM | POA: Diagnosis not present

## 2022-05-17 DIAGNOSIS — Z5111 Encounter for antineoplastic chemotherapy: Secondary | ICD-10-CM | POA: Diagnosis not present

## 2022-05-17 DIAGNOSIS — C678 Malignant neoplasm of overlapping sites of bladder: Secondary | ICD-10-CM | POA: Diagnosis not present

## 2022-05-20 ENCOUNTER — Ambulatory Visit: Payer: Medicare PPO

## 2022-05-24 ENCOUNTER — Ambulatory Visit: Payer: Medicare PPO

## 2022-05-24 DIAGNOSIS — C678 Malignant neoplasm of overlapping sites of bladder: Secondary | ICD-10-CM | POA: Diagnosis not present

## 2022-05-24 DIAGNOSIS — Z5111 Encounter for antineoplastic chemotherapy: Secondary | ICD-10-CM | POA: Diagnosis not present

## 2022-05-26 ENCOUNTER — Ambulatory Visit: Payer: Medicare PPO

## 2022-05-26 ENCOUNTER — Ambulatory Visit
Admission: RE | Admit: 2022-05-26 | Discharge: 2022-05-26 | Disposition: A | Discharge status: Home / Self Care | Payer: Medicare PPO | Source: Ambulatory Visit | Attending: Physician Assistant | Admitting: Physician Assistant

## 2022-05-26 DIAGNOSIS — Z1231 Encounter for screening mammogram for malignant neoplasm of breast: Secondary | ICD-10-CM | POA: Diagnosis not present

## 2022-05-31 DIAGNOSIS — Z5111 Encounter for antineoplastic chemotherapy: Secondary | ICD-10-CM | POA: Diagnosis not present

## 2022-05-31 DIAGNOSIS — C678 Malignant neoplasm of overlapping sites of bladder: Secondary | ICD-10-CM | POA: Diagnosis not present

## 2022-06-07 DIAGNOSIS — Z5111 Encounter for antineoplastic chemotherapy: Secondary | ICD-10-CM | POA: Diagnosis not present

## 2022-06-07 DIAGNOSIS — C678 Malignant neoplasm of overlapping sites of bladder: Secondary | ICD-10-CM | POA: Diagnosis not present

## 2022-06-07 DIAGNOSIS — R8271 Bacteriuria: Secondary | ICD-10-CM | POA: Diagnosis not present

## 2022-06-14 DIAGNOSIS — C678 Malignant neoplasm of overlapping sites of bladder: Secondary | ICD-10-CM | POA: Diagnosis not present

## 2022-06-14 DIAGNOSIS — Z5111 Encounter for antineoplastic chemotherapy: Secondary | ICD-10-CM | POA: Diagnosis not present

## 2022-06-22 DIAGNOSIS — Z124 Encounter for screening for malignant neoplasm of cervix: Secondary | ICD-10-CM | POA: Diagnosis not present

## 2022-06-22 DIAGNOSIS — Z01419 Encounter for gynecological examination (general) (routine) without abnormal findings: Secondary | ICD-10-CM | POA: Diagnosis not present

## 2022-06-22 DIAGNOSIS — R87610 Atypical squamous cells of undetermined significance on cytologic smear of cervix (ASC-US): Secondary | ICD-10-CM | POA: Diagnosis not present

## 2022-07-26 DIAGNOSIS — C678 Malignant neoplasm of overlapping sites of bladder: Secondary | ICD-10-CM | POA: Diagnosis not present

## 2022-08-09 DIAGNOSIS — Z8551 Personal history of malignant neoplasm of bladder: Secondary | ICD-10-CM | POA: Diagnosis not present

## 2022-08-09 DIAGNOSIS — N952 Postmenopausal atrophic vaginitis: Secondary | ICD-10-CM | POA: Diagnosis not present

## 2022-08-09 DIAGNOSIS — M8580 Other specified disorders of bone density and structure, unspecified site: Secondary | ICD-10-CM | POA: Diagnosis not present

## 2022-08-09 DIAGNOSIS — H409 Unspecified glaucoma: Secondary | ICD-10-CM | POA: Diagnosis not present

## 2022-08-09 DIAGNOSIS — I10 Essential (primary) hypertension: Secondary | ICD-10-CM | POA: Diagnosis not present

## 2022-08-09 DIAGNOSIS — Z79890 Hormone replacement therapy (postmenopausal): Secondary | ICD-10-CM | POA: Diagnosis not present

## 2022-08-09 DIAGNOSIS — E785 Hyperlipidemia, unspecified: Secondary | ICD-10-CM | POA: Diagnosis not present

## 2022-08-09 DIAGNOSIS — M1990 Unspecified osteoarthritis, unspecified site: Secondary | ICD-10-CM | POA: Diagnosis not present

## 2022-11-02 DIAGNOSIS — C678 Malignant neoplasm of overlapping sites of bladder: Secondary | ICD-10-CM | POA: Diagnosis not present

## 2022-11-29 DIAGNOSIS — Z23 Encounter for immunization: Secondary | ICD-10-CM | POA: Diagnosis not present

## 2022-11-29 DIAGNOSIS — E2839 Other primary ovarian failure: Secondary | ICD-10-CM | POA: Diagnosis not present

## 2022-11-29 DIAGNOSIS — H409 Unspecified glaucoma: Secondary | ICD-10-CM | POA: Diagnosis not present

## 2022-11-29 DIAGNOSIS — I10 Essential (primary) hypertension: Secondary | ICD-10-CM | POA: Diagnosis not present

## 2022-11-29 DIAGNOSIS — E7800 Pure hypercholesterolemia, unspecified: Secondary | ICD-10-CM | POA: Diagnosis not present

## 2022-11-29 DIAGNOSIS — C679 Malignant neoplasm of bladder, unspecified: Secondary | ICD-10-CM | POA: Diagnosis not present

## 2022-11-29 DIAGNOSIS — Z0000 Encounter for general adult medical examination without abnormal findings: Secondary | ICD-10-CM | POA: Diagnosis not present

## 2023-02-07 DIAGNOSIS — C678 Malignant neoplasm of overlapping sites of bladder: Secondary | ICD-10-CM | POA: Diagnosis not present

## 2023-04-06 ENCOUNTER — Ambulatory Visit: Payer: Medicare PPO | Admitting: Podiatry

## 2023-04-06 ENCOUNTER — Encounter: Payer: Self-pay | Admitting: Podiatry

## 2023-04-06 DIAGNOSIS — L84 Corns and callosities: Secondary | ICD-10-CM | POA: Diagnosis not present

## 2023-04-06 DIAGNOSIS — Q828 Other specified congenital malformations of skin: Secondary | ICD-10-CM

## 2023-04-07 NOTE — Progress Notes (Signed)
 Subjective:   Patient ID: Kayla Barker, female   DOB: 73 y.o.   MRN: [id number]   HPI Patient presents with pain on the bottom of the left foot and is not sure what this may be.  States it has been there for a shorter period of time but can be bothersome and patient does not currently smoke likes to be active   Review of Systems  All other systems reviewed and are negative.       Objective:  Physical Exam Vitals and nursing note reviewed.  Constitutional:      Appearance: She is well-developed.  Pulmonary:     Effort: Pulmonary effort is normal.  Musculoskeletal:        General: Normal range of motion.  Skin:    General: Skin is warm.  Neurological:     Mental Status: She is alert.     Neurovascular status intact muscle strength adequate range of motion adequate keratotic lesion in the distal portion of the metatarsal left that is small and appears to be more of dried blood underneath it with good digital perfusion well-oriented     Assessment:  Probability for lesion that is porokeratotic cannot rule out other pathology     Plan:  H&P reviewed today sterile sharp debridement of the lesion accomplished no iatrogenic bleeding of the area and I was able to get rid of all of the dark tissue and it appeared to be strictly dried blood.  Advised this patient to come in if any issues were to occur should heal uneventfully

## 2023-05-09 DIAGNOSIS — C678 Malignant neoplasm of overlapping sites of bladder: Secondary | ICD-10-CM | POA: Diagnosis not present

## 2023-05-12 ENCOUNTER — Other Ambulatory Visit: Payer: Self-pay | Admitting: Physician Assistant

## 2023-05-12 DIAGNOSIS — Z1231 Encounter for screening mammogram for malignant neoplasm of breast: Secondary | ICD-10-CM

## 2023-05-17 DIAGNOSIS — H401131 Primary open-angle glaucoma, bilateral, mild stage: Secondary | ICD-10-CM | POA: Diagnosis not present

## 2023-05-17 DIAGNOSIS — H35372 Puckering of macula, left eye: Secondary | ICD-10-CM | POA: Diagnosis not present

## 2023-05-17 DIAGNOSIS — Z961 Presence of intraocular lens: Secondary | ICD-10-CM | POA: Diagnosis not present

## 2023-05-31 ENCOUNTER — Ambulatory Visit
Admission: RE | Admit: 2023-05-31 | Discharge: 2023-05-31 | Disposition: A | Discharge status: Home / Self Care | Source: Ambulatory Visit | Attending: Physician Assistant | Admitting: Physician Assistant

## 2023-05-31 DIAGNOSIS — Z1231 Encounter for screening mammogram for malignant neoplasm of breast: Secondary | ICD-10-CM | POA: Diagnosis not present

## 2023-05-31 DIAGNOSIS — E2839 Other primary ovarian failure: Secondary | ICD-10-CM | POA: Diagnosis not present

## 2023-05-31 DIAGNOSIS — I10 Essential (primary) hypertension: Secondary | ICD-10-CM | POA: Diagnosis not present

## 2023-08-16 DIAGNOSIS — C678 Malignant neoplasm of overlapping sites of bladder: Secondary | ICD-10-CM | POA: Diagnosis not present

## 2023-11-14 DIAGNOSIS — Z961 Presence of intraocular lens: Secondary | ICD-10-CM | POA: Diagnosis not present

## 2023-11-14 DIAGNOSIS — H401131 Primary open-angle glaucoma, bilateral, mild stage: Secondary | ICD-10-CM | POA: Diagnosis not present

## 2023-11-21 DIAGNOSIS — C674 Malignant neoplasm of posterior wall of bladder: Secondary | ICD-10-CM | POA: Diagnosis not present

## 2023-12-06 ENCOUNTER — Other Ambulatory Visit (HOSPITAL_BASED_OUTPATIENT_CLINIC_OR_DEPARTMENT_OTHER): Payer: Self-pay | Admitting: Physician Assistant

## 2023-12-06 DIAGNOSIS — Z1331 Encounter for screening for depression: Secondary | ICD-10-CM | POA: Diagnosis not present

## 2023-12-06 DIAGNOSIS — Z23 Encounter for immunization: Secondary | ICD-10-CM | POA: Diagnosis not present

## 2023-12-06 DIAGNOSIS — Z1339 Encounter for screening examination for other mental health and behavioral disorders: Secondary | ICD-10-CM | POA: Diagnosis not present

## 2023-12-06 DIAGNOSIS — M8580 Other specified disorders of bone density and structure, unspecified site: Secondary | ICD-10-CM | POA: Diagnosis not present

## 2023-12-06 DIAGNOSIS — C679 Malignant neoplasm of bladder, unspecified: Secondary | ICD-10-CM | POA: Diagnosis not present

## 2023-12-06 DIAGNOSIS — E7800 Pure hypercholesterolemia, unspecified: Secondary | ICD-10-CM | POA: Diagnosis not present

## 2023-12-06 DIAGNOSIS — Z0000 Encounter for general adult medical examination without abnormal findings: Secondary | ICD-10-CM | POA: Diagnosis not present

## 2023-12-06 DIAGNOSIS — I10 Essential (primary) hypertension: Secondary | ICD-10-CM | POA: Diagnosis not present

## 2023-12-06 DIAGNOSIS — Z13820 Encounter for screening for osteoporosis: Secondary | ICD-10-CM
